# Patient Record
Sex: Male | Born: 1955 | Race: White | Hispanic: No | Marital: Married | Smoking: Never smoker
Health system: Southern US, Community
[De-identification: ages and names within clinical notes are randomized; demographics above are authoritative.]

## PROBLEM LIST (undated history)

## (undated) DIAGNOSIS — I10 Essential (primary) hypertension: Secondary | ICD-10-CM

## (undated) DIAGNOSIS — E119 Type 2 diabetes mellitus without complications: Secondary | ICD-10-CM

## (undated) HISTORY — PX: COLONOSCOPY: SHX174

---

## 2012-04-27 ENCOUNTER — Ambulatory Visit: Payer: Self-pay | Admitting: Internal Medicine

## 2012-05-07 ENCOUNTER — Ambulatory Visit: Payer: Self-pay | Admitting: Internal Medicine

## 2013-07-27 DIAGNOSIS — E785 Hyperlipidemia, unspecified: Secondary | ICD-10-CM | POA: Insufficient documentation

## 2013-10-13 ENCOUNTER — Ambulatory Visit: Payer: Self-pay | Admitting: Gastroenterology

## 2015-01-30 DIAGNOSIS — IMO0002 Reserved for concepts with insufficient information to code with codable children: Secondary | ICD-10-CM | POA: Insufficient documentation

## 2015-02-02 DIAGNOSIS — Z79899 Other long term (current) drug therapy: Secondary | ICD-10-CM | POA: Insufficient documentation

## 2016-02-08 DIAGNOSIS — E538 Deficiency of other specified B group vitamins: Secondary | ICD-10-CM | POA: Insufficient documentation

## 2016-08-07 DIAGNOSIS — N521 Erectile dysfunction due to diseases classified elsewhere: Secondary | ICD-10-CM

## 2016-08-07 DIAGNOSIS — E1169 Type 2 diabetes mellitus with other specified complication: Secondary | ICD-10-CM | POA: Insufficient documentation

## 2016-08-08 DIAGNOSIS — E291 Testicular hypofunction: Secondary | ICD-10-CM | POA: Insufficient documentation

## 2017-09-05 ENCOUNTER — Encounter: Payer: Self-pay | Admitting: Emergency Medicine

## 2017-09-05 ENCOUNTER — Other Ambulatory Visit: Payer: Self-pay

## 2017-09-05 ENCOUNTER — Ambulatory Visit
Admission: EM | Admit: 2017-09-05 | Discharge: 2017-09-05 | Disposition: A | Discharge status: Home / Self Care | Payer: BC Managed Care – PPO | Attending: Family Medicine | Admitting: Family Medicine

## 2017-09-05 DIAGNOSIS — T25222A Burn of second degree of left foot, initial encounter: Secondary | ICD-10-CM

## 2017-09-05 DIAGNOSIS — Z23 Encounter for immunization: Secondary | ICD-10-CM | POA: Diagnosis not present

## 2017-09-05 HISTORY — DX: Type 2 diabetes mellitus without complications: E11.9

## 2017-09-05 HISTORY — DX: Essential (primary) hypertension: I10

## 2017-09-05 MED ORDER — TETANUS-DIPHTH-ACELL PERTUSSIS 5-2.5-18.5 LF-MCG/0.5 IM SUSP
0.5000 mL | Freq: Once | INTRAMUSCULAR | Status: AC
  Administered 2017-09-05: 0.5 mL via INTRAMUSCULAR

## 2017-09-05 MED ORDER — CEPHALEXIN 500 MG PO CAPS: 500.0000 mg | ORAL_CAPSULE | Freq: Three times a day (TID) | ORAL | 0 refills | Status: AC

## 2017-09-05 MED ORDER — SILVER SULFADIAZINE 1 % EX CREA: 1.0000 "application " | TOPICAL_CREAM | Freq: Every day | CUTANEOUS | 0 refills | Status: DC

## 2017-09-05 NOTE — ED Triage Notes (Signed)
Patient c/o burn to the bottom of his left foot on Tuesday. Patient stated he stepped on an electric charcoal starter. Patient has been applying antibiotic ointment to the area and has kept it covered with gauze.

## 2017-09-05 NOTE — ED Provider Notes (Signed)
[facility] ____________________________________________  Time seen: Approximately 7:13 PM  I have reviewed the triage vital signs and the nursing notes.   HISTORY  Chief Complaint Foot Burn   HPI Jesse Mays is a 62 y.o. male presenting for evaluation of left plantar foot burn that occurred this past Tuesday night.  States that he was using an electric charcoal grill starter to start his grill, tested on the ground, but then accidentally stepped on it barefoot.  States for the last few days the area has remained intact and has not been very tender, but has changed his gait to walk on the side of his foot more.  Today he has been using a cane to walk as well, but states that the area was more tender prompted him to come in.  Denies any drainage.  No paresthesias or pain unless blisters directly touch.  No other pain or discomfort radiation.  No fevers.  Reports otherwise feels well.  Has not been taking any oral medications for the same complaints.  States has been keeping clean and applying topical Neosporin.  Type II diabetic on oral metformin.  Unsure of last tetanus immunization. Denies recent sickness. Denies recent antibiotic use.   Mickey Farberhies, David, MD: PCP   Past Medical History:  Diagnosis Date  . Diabetes mellitus without complication (HCC)   . Hypertension     There are no active problems to display for this patient.   History reviewed. No pertinent surgical history.   No current facility-administered medications for this encounter.   Current Outpatient Medications:  .  felodipine (PLENDIL) 5 MG 24 hr tablet, Take by mouth., Disp: , Rfl:  .  glucose blood (ONE TOUCH ULTRA TEST) test strip, USE 2 (TWO) TIMES DAILY. USE AS INSTRUCTED., Disp: , Rfl:  .  lisinopril (PRINIVIL,ZESTRIL) 40 MG tablet, Take by mouth., Disp: , Rfl:  .  metFORMIN (GLUCOPHAGE-XR) 500 MG 24 hr tablet, Take by mouth., Disp: , Rfl:  .  simvastatin (ZOCOR) 20 MG tablet, Take by  mouth., Disp: , Rfl:  .  vitamin B-12 (CYANOCOBALAMIN) 1000 MCG tablet, Take by mouth., Disp: , Rfl:  .  cephALEXin (KEFLEX) 500 MG capsule, Take 1 capsule (500 mg total) by mouth 3 (three) times daily for 7 days., Disp: 21 capsule, Rfl: 0 .  silver sulfADIAZINE (SILVADENE) 1 % cream, Apply 1 application topically daily., Disp: 50 g, Rfl: 0  Allergies Patient has no known allergies.  History reviewed. No pertinent family history.  Social History Social History   Tobacco Use  . Smoking status: Never Smoker  . Smokeless tobacco: Never Used  Substance Use Topics  . Alcohol use: Yes    Comment: socially  . Drug use: Never    Review of Systems Constitutional: No fever/chills Cardiovascular: Denies chest pain. Respiratory: Denies shortness of breath. Gastrointestinal: No abdominal pain.   Skin: as above.   ____________________________________________   PHYSICAL EXAM:  VITAL SIGNS: ED Triage Vitals [09/05/17 1827]  Enc Vitals Group     BP (!) 146/85     Pulse Rate 67     Resp 18     Temp 98.1 F (36.7 C)     Temp Source Oral     SpO2 98 %     Weight 210 lb (95.3 kg)     Height 5\' 9"  (1.753 m)     Head Circumference      Peak Flow      Pain Score 5     Pain  Loc      Pain Edu?      Excl. in GC?     Constitutional: Alert and oriented. Well appearing and in no acute distress. ENT      Head: Normocephalic and atraumatic. Cardiovascular: Normal rate, regular rhythm. Grossly normal heart sounds.  Good peripheral circulation. Respiratory: Normal respiratory effort without tachypnea nor retractions. Breath sounds are clear and equal bilaterally. No wheezes, rales, rhonchi. Musculoskeletal:   Bilateral pedal pulses equal and easily palpated. Neurologic:  Normal speech and language.Speech is normal. No gait instability.  Skin:  Skin is warm, dry.  Except:     Left plantar burn as depicted above, mild surrounding blanching erythema, blister intact without drainage and  mildly tender to direct palpation, no surrounding tenderness, no bony tenderness, normal distal sensation and capillary refill, left foot otherwise nontender and skin otherwise intact to left foot.  Left foot with full range of motion present.  Psychiatric: Mood and affect are normal. Speech and behavior are normal. Patient exhibits appropriate insight and judgment   ___________________________________________   LABS (all labs ordered are listed, but only abnormal results are displayed)  Labs Reviewed - No data to display ____________________________________________  PROCEDURES Procedures   INITIAL IMPRESSION / ASSESSMENT AND PLAN / ED COURSE  Pertinent labs & imaging results that were available during my care of the patient were reviewed by me and considered in my medical decision making (see chart for details).  Well-appearing patient.  No acute distress.  Left plantar foot burn, second-degree, concern for possible secondary infection versus inflammatory pain.  Tetanus immunization updated.  Will treat with oral Keflex and topical Silvadene.  Discussed wound care and cleaning.  Recommend close follow-up.  Follow-up with primary care, but also recommend follow-up with [facility], information given and directed to call today to schedule outpatient appointment.  Discussed strict sooner follow-up and return parameters.Discussed indication, risks and benefits of medications with patient.   Discussed follow up and return parameters including no resolution or any worsening concerns. Patient verbalized understanding and agreed to plan.   Reviewed patient and plan of care with Dr Judd Gaudier who also saw patient, and agreed to plan.   ____________________________________________   FINAL CLINICAL IMPRESSION(S) / ED DIAGNOSES  Final diagnoses:  Partial thickness burn of left foot, initial encounter     ED Discharge Orders         Ordered    silver sulfADIAZINE (SILVADENE) 1 % cream  Daily      09/05/17 1924    cephALEXin (KEFLEX) 500 MG capsule  3 times daily     09/05/17 1924           Note: This dictation was prepared with Dragon dictation along with smaller phrase technology. Any transcriptional errors that result from this process are unintentional.         Renford Dills, NP 09/05/17 1950

## 2017-09-05 NOTE — Discharge Instructions (Addendum)
Take medication as prescribed and use topical antibiotic.  Keep clean.  Monitor closely.  [facility]  See below to call and schedule appointment.  Follow up with your primary care physician this week as needed. Return to Urgent care or emergency room for any increased pain, swelling, drainage or worsening concerns.   [facility] [id number] [place], [place] [id number] Burn [facility]: Appointments # [phone]) [phone]

## 2017-10-09 ENCOUNTER — Emergency Department: Payer: BC Managed Care – PPO

## 2017-10-09 ENCOUNTER — Encounter: Payer: Self-pay | Admitting: Intensive Care

## 2017-10-09 ENCOUNTER — Inpatient Hospital Stay
Admission: EM | Admit: 2017-10-09 | Discharge: 2017-10-15 | DRG: 871 | Disposition: A | Discharge status: Home / Self Care | Payer: BC Managed Care – PPO | Attending: Internal Medicine | Admitting: Internal Medicine

## 2017-10-09 ENCOUNTER — Other Ambulatory Visit: Payer: Self-pay

## 2017-10-09 DIAGNOSIS — A419 Sepsis, unspecified organism: Secondary | ICD-10-CM | POA: Diagnosis present

## 2017-10-09 DIAGNOSIS — R945 Abnormal results of liver function studies: Secondary | ICD-10-CM | POA: Diagnosis present

## 2017-10-09 DIAGNOSIS — Z7141 Alcohol abuse counseling and surveillance of alcoholic: Secondary | ICD-10-CM | POA: Diagnosis not present

## 2017-10-09 DIAGNOSIS — Z79899 Other long term (current) drug therapy: Secondary | ICD-10-CM

## 2017-10-09 DIAGNOSIS — Z7984 Long term (current) use of oral hypoglycemic drugs: Secondary | ICD-10-CM | POA: Diagnosis not present

## 2017-10-09 DIAGNOSIS — I10 Essential (primary) hypertension: Secondary | ICD-10-CM | POA: Diagnosis present

## 2017-10-09 DIAGNOSIS — J810 Acute pulmonary edema: Secondary | ICD-10-CM | POA: Diagnosis not present

## 2017-10-09 DIAGNOSIS — R6520 Severe sepsis without septic shock: Secondary | ICD-10-CM | POA: Diagnosis present

## 2017-10-09 DIAGNOSIS — I152 Hypertension secondary to endocrine disorders: Secondary | ICD-10-CM | POA: Diagnosis present

## 2017-10-09 DIAGNOSIS — E1310 Other specified diabetes mellitus with ketoacidosis without coma: Secondary | ICD-10-CM | POA: Diagnosis present

## 2017-10-09 DIAGNOSIS — E1110 Type 2 diabetes mellitus with ketoacidosis without coma: Secondary | ICD-10-CM | POA: Diagnosis present

## 2017-10-09 DIAGNOSIS — K852 Alcohol induced acute pancreatitis without necrosis or infection: Secondary | ICD-10-CM | POA: Diagnosis present

## 2017-10-09 DIAGNOSIS — E876 Hypokalemia: Secondary | ICD-10-CM | POA: Diagnosis present

## 2017-10-09 DIAGNOSIS — K8521 Alcohol induced acute pancreatitis with uninfected necrosis: Secondary | ICD-10-CM | POA: Diagnosis not present

## 2017-10-09 DIAGNOSIS — E1159 Type 2 diabetes mellitus with other circulatory complications: Secondary | ICD-10-CM | POA: Diagnosis present

## 2017-10-09 DIAGNOSIS — J9601 Acute respiratory failure with hypoxia: Secondary | ICD-10-CM | POA: Diagnosis present

## 2017-10-09 DIAGNOSIS — Z7982 Long term (current) use of aspirin: Secondary | ICD-10-CM

## 2017-10-09 DIAGNOSIS — I517 Cardiomegaly: Secondary | ICD-10-CM | POA: Diagnosis not present

## 2017-10-09 DIAGNOSIS — E1169 Type 2 diabetes mellitus with other specified complication: Secondary | ICD-10-CM

## 2017-10-09 DIAGNOSIS — K859 Acute pancreatitis without necrosis or infection, unspecified: Secondary | ICD-10-CM | POA: Diagnosis not present

## 2017-10-09 DIAGNOSIS — Z716 Tobacco abuse counseling: Secondary | ICD-10-CM | POA: Diagnosis not present

## 2017-10-09 DIAGNOSIS — Z833 Family history of diabetes mellitus: Secondary | ICD-10-CM | POA: Diagnosis not present

## 2017-10-09 DIAGNOSIS — E8770 Fluid overload, unspecified: Secondary | ICD-10-CM | POA: Diagnosis not present

## 2017-10-09 DIAGNOSIS — R0602 Shortness of breath: Secondary | ICD-10-CM

## 2017-10-09 DIAGNOSIS — R17 Unspecified jaundice: Secondary | ICD-10-CM | POA: Diagnosis not present

## 2017-10-09 DIAGNOSIS — R109 Unspecified abdominal pain: Secondary | ICD-10-CM

## 2017-10-09 LAB — URINALYSIS, ROUTINE W REFLEX MICROSCOPIC
BACTERIA UA: NONE SEEN
BILIRUBIN URINE: NEGATIVE
Hgb urine dipstick: NEGATIVE
Ketones, ur: 20 mg/dL — AB
LEUKOCYTES UA: NEGATIVE
Nitrite: NEGATIVE
PROTEIN: NEGATIVE mg/dL
Specific Gravity, Urine: 1.026 (ref 1.005–1.030)
Squamous Epithelial / LPF: NONE SEEN (ref 0–5)
pH: 5 (ref 5.0–8.0)

## 2017-10-09 LAB — COMPREHENSIVE METABOLIC PANEL
ALBUMIN: 4.9 g/dL (ref 3.5–5.0)
ALK PHOS: 60 U/L (ref 38–126)
ALT: 23 U/L (ref 0–44)
ANION GAP: 16 — AB (ref 5–15)
AST: 25 U/L (ref 15–41)
BILIRUBIN TOTAL: 1 mg/dL (ref 0.3–1.2)
BUN: 15 mg/dL (ref 8–23)
CALCIUM: 9.8 mg/dL (ref 8.9–10.3)
CO2: 22 mmol/L (ref 22–32)
Chloride: 102 mmol/L (ref 98–111)
Creatinine, Ser: 1.08 mg/dL (ref 0.61–1.24)
GFR calc Af Amer: >60 mL/min (ref >60)
GLUCOSE: 328 mg/dL — AB (ref 70–99)
Potassium: 3.8 mmol/L (ref 3.5–5.1)
Sodium: 140 mmol/L (ref 135–145)
TOTAL PROTEIN: 7.6 g/dL (ref 6.5–8.1)

## 2017-10-09 LAB — CBC
HCT: 51 % (ref 40.0–52.0)
Hemoglobin: 18 g/dL (ref 13.0–18.0)
MCH: 33 pg (ref 26.0–34.0)
MCHC: 35.3 g/dL (ref 32.0–36.0)
MCV: 93.7 fL (ref 80.0–100.0)
Platelets: 289 10*3/uL (ref 150–440)
RBC: 5.45 MIL/uL (ref 4.40–5.90)
RDW: 13.1 % (ref 11.5–14.5)
WBC: 26 10*3/uL — ABNORMAL HIGH (ref 3.8–10.6)

## 2017-10-09 LAB — BLOOD GAS, VENOUS
Acid-base deficit: 7.5 mmol/L — ABNORMAL HIGH (ref 0.0–2.0)
BICARBONATE: 20.5 mmol/L (ref 20.0–28.0)
O2 SAT: 52.4 %
PATIENT TEMPERATURE: 37
PH VEN: 7.23 — AB (ref 7.250–7.430)
pCO2, Ven: 49 mmHg (ref 44.0–60.0)
pO2, Ven: 34 mmHg (ref 32.0–45.0)

## 2017-10-09 LAB — LACTIC ACID, PLASMA
LACTIC ACID, VENOUS: 4.8 mmol/L — AB (ref 0.5–1.9)
Lactic Acid, Venous: 4.2 mmol/L (ref 0.5–1.9)

## 2017-10-09 LAB — GLUCOSE, CAPILLARY
Glucose-Capillary: 331 mg/dL — ABNORMAL HIGH (ref 70–99)
Glucose-Capillary: 322 mg/dL — ABNORMAL HIGH (ref 70–99)
Glucose-Capillary: 228 mg/dL — ABNORMAL HIGH (ref 70–99)

## 2017-10-09 LAB — LIPASE, BLOOD: Lipase: 748 U/L — ABNORMAL HIGH (ref 11–51)

## 2017-10-09 MED ORDER — ONDANSETRON 4 MG PO TBDP: 4.0000 mg | ORAL_TABLET | Freq: Once | ORAL | Status: DC | PRN

## 2017-10-09 MED ORDER — ONDANSETRON HCL 4 MG/2ML IJ SOLN
4.0000 mg | Freq: Once | INTRAMUSCULAR | Status: AC
  Administered 2017-10-09: 4 mg via INTRAVENOUS
  Filled 2017-10-09: qty 2

## 2017-10-09 MED ORDER — SODIUM CHLORIDE 0.9 % IV SOLN
1.0000 g | Freq: Three times a day (TID) | INTRAVENOUS | Status: DC
  Administered 2017-10-10 – 2017-10-13 (×11): 1 g via INTRAVENOUS
  Filled 2017-10-09 (×15): qty 1

## 2017-10-09 MED ORDER — HYDROMORPHONE HCL 1 MG/ML IJ SOLN
1.0000 mg | Freq: Once | INTRAMUSCULAR | Status: AC
  Administered 2017-10-09: 1 mg via INTRAVENOUS
  Filled 2017-10-09: qty 1

## 2017-10-09 MED ORDER — SODIUM CHLORIDE 0.9 % IV SOLN
INTRAVENOUS | Status: DC
  Administered 2017-10-09: 2.7 [IU]/h via INTRAVENOUS
  Filled 2017-10-09: qty 1

## 2017-10-09 MED ORDER — SODIUM CHLORIDE 0.9 % IV BOLUS
1000.0000 mL | Freq: Once | INTRAVENOUS | Status: AC
  Administered 2017-10-09: 1000 mL via INTRAVENOUS

## 2017-10-09 MED ORDER — PIPERACILLIN-TAZOBACTAM 3.375 G IVPB 30 MIN
3.3750 g | Freq: Once | INTRAVENOUS | Status: AC
  Administered 2017-10-09: 3.375 g via INTRAVENOUS
  Filled 2017-10-09: qty 50

## 2017-10-09 MED ORDER — IOPAMIDOL (ISOVUE-300) INJECTION 61%
100.0000 mL | Freq: Once | INTRAVENOUS | Status: AC | PRN
  Administered 2017-10-09: 100 mL via INTRAVENOUS

## 2017-10-09 MED ORDER — PROMETHAZINE HCL 25 MG/ML IJ SOLN
25.0000 mg | Freq: Once | INTRAMUSCULAR | Status: AC
  Administered 2017-10-09: 25 mg via INTRAVENOUS
  Filled 2017-10-09: qty 1

## 2017-10-09 NOTE — ED Notes (Signed)
Patient transported to CT 

## 2017-10-09 NOTE — ED Triage Notes (Signed)
Patient presents with abd pain with N/V/D that started today. Reports chills at home. Took tylenol around 5pm.

## 2017-10-09 NOTE — ED Notes (Signed)
MD Sharma Covert notified that pts HR increased to 136 and oxygen decreased to 88%. MD at bedside. 2L nasal cannula applied.

## 2017-10-09 NOTE — ED Notes (Signed)
Charge nurse notified of lactic acid

## 2017-10-09 NOTE — Progress Notes (Signed)
CODE SEPSIS - PHARMACY COMMUNICATION  **Broad Spectrum Antibiotics should be administered within 1 hour of Sepsis diagnosis**  Time Code Sepsis Called/Page Received: 19:39  Antibiotics Ordered: Zosyn  Time of 1st antibiotic administration: 20:19 spoke with Trula Ore RN - she's only been able to get one set of blood cultures but she'll see if EDP wants to start Zosyn before second set.  Additional action taken by pharmacy: 20:21  If necessary, Name of Provider/Nurse Contacted:     Carola Frost, Pharm.D., BCPS Clinical Pharmacist  10/09/2017  7:45 PM

## 2017-10-09 NOTE — Progress Notes (Signed)
Pharmacy Antibiotic Note  Jesse Mays is a 62 y.o. male admitted on 10/09/2017 with intra-abdominal infection.  Pharmacy has been consulted for meropenem dosing.  Plan: Meropenem 1 gram q 8 hours ordered.  Height: 5\' 9"  (175.3 cm) Weight: 210 lb (95.3 kg) IBW/kg (Calculated) : 70.7  Temp (24hrs), Avg:98.2 F (36.8 C), Min:98.1 F (36.7 C), Max:98.2 F (36.8 C)  Recent Labs  Lab 10/09/17 1837 10/09/17 2051  WBC 26.0*  --   CREATININE 1.08  --   LATICACIDVEN 4.2* 4.8*    Estimated Creatinine Clearance: 80.7 mL/min (by C-G formula based on SCr of 1.08 mg/dL).    No Known Allergies  Antimicrobials this admission: Zosyn x1; meropenem 10/4  >>    >>   Dose adjustments this admission:   Microbiology results: 10/3 BCx: pending 10/3 UCx: pending       10/3 UA: (-)  Thank you for allowing pharmacy to be a part of this patient's care.  Myrtie Leuthold S 10/09/2017 11:13 PM

## 2017-10-09 NOTE — ED Notes (Signed)
Charge nurse notified of pt's WBC; bed requested

## 2017-10-09 NOTE — H&P (Addendum)
[facility] [place] [facility]   PATIENT NAME: Jesse Mays    MR#:  [id number]  DATE OF BIRTH:  [birth year]  DATE OF ADMISSION:  10/09/2017  PRIMARY CARE PHYSICIAN: Mickey Farber, MD   REQUESTING/REFERRING PHYSICIAN: Sharma Covert, MD  CHIEF COMPLAINT:   Chief Complaint  Patient presents with  . Abdominal Pain  . Fever  . Diarrhea    HISTORY OF PRESENT ILLNESS:  Jesse Mays  is a 62 y.o. male who presents with chief complaint as above.  Patient presents to the ED with sudden onset of upper abdominal pain.  He also developed a fever.  Here in the ED he is found to meet sepsis criteria with elevated white blood cell count, tachycardia.  On work-up he was found to have pancreatitis with possible early necrosis seen on imaging.  Hospitalist were called for admission  PAST MEDICAL HISTORY:   Past Medical History:  Diagnosis Date  . Diabetes mellitus without complication (HCC)   . Hypertension      PAST SURGICAL HISTORY:   Past Surgical History:  Procedure Laterality Date  . COLONOSCOPY       SOCIAL HISTORY:   Social History   Tobacco Use  . Smoking status: Never Smoker  . Smokeless tobacco: Never Used  Substance Use Topics  . Alcohol use: Yes    Comment: socially     FAMILY HISTORY:   Family History  Problem Relation Age of Onset  . Diabetes Mother   . Diabetes Father      DRUG ALLERGIES:  No Known Allergies  MEDICATIONS AT HOME:   Prior to Admission medications   Medication Sig Start Date End Date Taking? Authorizing Provider  aspirin EC 81 MG tablet Take 81 mg by mouth daily.   Yes [provider]  felodipine (PLENDIL) 5 MG 24 hr tablet Take 5 mg by mouth daily.  05/13/17  Yes [provider]  glucose blood (ONE TOUCH ULTRA TEST) test strip 1 each by Other route 2 (two) times daily.  05/19/17  Yes [provider]  lisinopril (PRINIVIL,ZESTRIL) 40 MG tablet Take 40 mg by mouth daily.   01/31/17 01/31/18 Yes [provider]  metFORMIN (GLUCOPHAGE-XR) 500 MG 24 hr tablet Take 1,000 mg by mouth 2 (two) times daily.  09/01/17  Yes [provider]  simvastatin (ZOCOR) 20 MG tablet Take 20 mg by mouth daily.  09/01/17  Yes [provider]  vitamin B-12 (CYANOCOBALAMIN) 1000 MCG tablet Take 1,000 mcg by mouth daily.    Yes [provider]  silver sulfADIAZINE (SILVADENE) 1 % cream Apply 1 application topically daily. Patient not taking: Reported on 10/09/2017 09/05/17   Renford Dills, NP    REVIEW OF SYSTEMS:  Review of Systems  Constitutional: Positive for fever. Negative for chills, malaise/fatigue and weight loss.  HENT: Negative for ear pain, hearing loss and tinnitus.   Eyes: Negative for blurred vision, double vision, pain and redness.  Respiratory: Negative for cough, hemoptysis and shortness of breath.   Cardiovascular: Negative for chest pain, palpitations, orthopnea and leg swelling.  Gastrointestinal: Positive for abdominal pain and nausea. Negative for constipation, diarrhea and vomiting.  Genitourinary: Negative for dysuria, frequency and hematuria.  Musculoskeletal: Negative for back pain, joint pain and neck pain.  Skin:       No acne, rash, or lesions  Neurological: Negative for dizziness, tremors, focal weakness and weakness.  Endo/Heme/Allergies: Negative for polydipsia. Does not bruise/bleed easily.  Psychiatric/Behavioral: Negative for depression. The  patient is not nervous/anxious and does not have insomnia.      VITAL SIGNS:   Vitals:   10/09/17 2151 10/09/17 2200 10/09/17 2218 10/09/17 2230  BP: (!) 187/108 (!) 188/106  (!) 190/106  Pulse: (!) 107 (!) 101 (!) 137 (!) 119  Resp: 18 (!) 22 (!) 22 12  Temp: 98.2 F (36.8 C)     TempSrc: Oral     SpO2: 94% 94% (!) 88% 98%  Weight:      Height:       Wt Readings from Last 3 Encounters:  10/09/17 95.3 kg  09/05/17 95.3 kg    PHYSICAL EXAMINATION:  Physical Exam   Vitals reviewed. Constitutional: He is oriented to person, place, and time. He appears well-developed and well-nourished. No distress.  HENT:  Head: Normocephalic and atraumatic.  Mouth/Throat: Oropharynx is clear and moist.  Eyes: Pupils are equal, round, and reactive to light. Conjunctivae and EOM are normal. No scleral icterus.  Neck: Normal range of motion. Neck supple. No JVD present. No thyromegaly present.  Cardiovascular: Regular rhythm and intact distal pulses. Exam reveals no gallop and no friction rub.  No murmur heard. tachycardic  Respiratory: Effort normal and breath sounds normal. No respiratory distress. He has no wheezes. He has no rales.  GI: Soft. Bowel sounds are normal. He exhibits no distension. There is tenderness.  Musculoskeletal: Normal range of motion. He exhibits no edema.  No arthritis, no gout  Lymphadenopathy:    He has no cervical adenopathy.  Neurological: He is alert and oriented to person, place, and time. No cranial nerve deficit.  No dysarthria, no aphasia  Skin: Skin is warm and dry. No rash noted. No erythema.  Psychiatric: He has a normal mood and affect. His behavior is normal. Judgment and thought content normal.    LABORATORY PANEL:   CBC Recent Labs  Lab 10/09/17 1837  WBC 26.0*  HGB 18.0  HCT 51.0  PLT 289   ------------------------------------------------------------------------------------------------------------------  Chemistries  Recent Labs  Lab 10/09/17 1837  NA 140  K 3.8  CL 102  CO2 22  GLUCOSE 328*  BUN 15  CREATININE 1.08  CALCIUM 9.8  AST 25  ALT 23  ALKPHOS 60  BILITOT 1.0   ------------------------------------------------------------------------------------------------------------------  Cardiac Enzymes No results for input(s): TROPONINI in the last 168 hours. ------------------------------------------------------------------------------------------------------------------  RADIOLOGY:  Ct Abdomen  Pelvis W Contrast  Result Date: 10/09/2017 CLINICAL DATA:  Generalized abdominal pain with nausea and vomiting EXAM: CT ABDOMEN AND PELVIS WITH CONTRAST TECHNIQUE: Multidetector CT imaging of the abdomen and pelvis was performed using the standard protocol following bolus administration of intravenous contrast. CONTRAST:  ISOVUE-300 IOPAMIDOL (ISOVUE-300) INJECTION 61% COMPARISON:  None. FINDINGS: Lower chest: Lung bases demonstrate no acute consolidation or effusion. The heart size is within normal limits. Hepatobiliary: No focal liver abnormality is seen. No gallstones, gallbladder wall thickening, or biliary dilatation. Pancreas: Large amount of peripancreatic inflammation and fluid consistent with acute pancreatitis. There is significant fatty replacement of the pancreas. There are areas of hypoenhancement at the pancreatic head and neck. No organized fluid collection. Spleen: Normal in size without focal abnormality. Adrenals/Urinary Tract: Adrenal glands are normal. Cyst in the upper pole right kidney. No hydronephrosis. Bladder unremarkable Stomach/Bowel: Stomach is within normal limits. Appendix is not well seen but no right lower quadrant inflammation. Sigmoid colon diverticular disease without acute inflammatory process. No evidence of bowel wall thickening, distention, or inflammatory changes. Vascular/Lymphatic: Nonaneurysmal aorta. Moderate aortic atherosclerosis. No significantly enlarged lymph  nodes. Normal portal vein and splenic vein enhancement. Reproductive: Prostate is unremarkable. Other: No free air. Moderate fluid within the right greater than left paracolic gutters. Musculoskeletal: No acute or significant osseous findings. IMPRESSION: 1. Moderate severe inflammatory change and fluid centered around the pancreas, consistent with acute pancreatitis. No organized fluid collections at this time. Mild hypoenhancement of the head and neck of the pancreas, possible early necrosis changes.  2. Sigmoid colon diverticular disease without acute inflammatory change. Electronically Signed   By: Jasmine Pang M.D.   On: 10/09/2017 21:40    EKG:   Orders placed or performed during the hospital encounter of 10/09/17  . EKG 12-Lead  . EKG 12-Lead  . EKG 12-Lead  . EKG 12-Lead  . ED EKG 12-Lead  . ED EKG 12-Lead    IMPRESSION AND PLAN:  Principal Problem:   Severe sepsis (HCC) -likely related to his pancreatitis has no other clear source was elucidated on work-up.  We are covering him with antibiotics.  His lactic acid was significantly elevated, IV fluid resuscitation was administered and we will continue IV fluids as well as trending his lactic until within normal limits.  His blood pressure has been stable, even somewhat elevated.  Cultures were sent. Active Problems:   Pancreatitis -keep patient n.p.o. for now, other treatment as above, repeat CT A/P in AM to evaluate for evolution of possible necrosis, GI consult   Diabetes (HCC) -significantly elevated.  The patient's anion gap on initial BMP was elevated, but barely so at 16.  He was started on insulin drip by ED physician, we will recheck his BMP and I suspect will be able to discontinue this even before admission to the floor.  Once were able to take him off the insulin drip, will administer sliding scale insulin with corresponding glucose checks.  We will do this every 6 hours for now as he is n.p.o.   HTN (hypertension) -continue home dose antihypertensives.  I suspect some of his elevated blood pressure is due to discomfort from pain related to his pancreatitis.  If he requires additional antihypertensives we will administer these PRN with a blood pressure goal less than 160/100  Chart review performed and case discussed with ED provider. Labs, imaging and/or ECG reviewed by provider and discussed with patient/family. Management plans discussed with the patient and/or family.  DVT PROPHYLAXIS: SubQ lovenox   GI  PROPHYLAXIS:  None  ADMISSION STATUS: Inpatient     CODE STATUS: Full  TOTAL TIME TAKING CARE OF THIS PATIENT: 45 minutes.   Nereyda Bowler FIELDING 10/09/2017, 11:02 PM  [employer]  [phone]  CC: Primary care physician; Mickey Farber, MD  Note:  This document was prepared using Dragon voice recognition software and may include unintentional dictation errors.

## 2017-10-09 NOTE — ED Provider Notes (Addendum)
[facility] Emergency Department Provider Note  ____________________________________________  Time seen: Approximately 7:48 PM  I have reviewed the triage vital signs and the nursing notes.   HISTORY  Chief Complaint Abdominal Pain; Fever; and Diarrhea    HPI Jesse Mays is a 62 y.o. male with a history of HTN, DM, presenting with abdominal pain, nausea vomiting and diarrhea.  The patient reports that at 230 this afternoon, he had acute onset of nausea vomiting and nonbloody diarrhea with associated use abdominal pain.  He did take his morning diabetes medications but has otherwise been unable to keep anything down.  He took Tylenol prior to leaving the house.  He denies any fevers or chills, dysuria or urinary frequency.  No lightheadedness or syncope.  SH: One beer daily.  Past Medical History:  Diagnosis Date  . Diabetes mellitus without complication (HCC)   . Hypertension     There are no active problems to display for this patient.   History reviewed. No pertinent surgical history.  Current Outpatient Rx  . Order #: [id number] Class: Historical Med  . Order #: [id number] Class: Historical Med  . Order #: [id number] Class: Historical Med  . Order #: [id number] Class: Historical Med  . Order #: [id number] Class: Historical Med  . Order #: [id number] Class: Historical Med  . Order #: [id number] Class: Historical Med  . Order #: [id number] Class: Normal    Allergies Patient has no known allergies.  History reviewed. No pertinent family history.  Social History Social History   Tobacco Use  . Smoking status: Never Smoker  . Smokeless tobacco: Never Used  Substance Use Topics  . Alcohol use: Yes    Comment: socially  . Drug use: Never    Review of Systems Constitutional: No fever/chills.  No lightheadedness or syncope.   Eyes: No visual changes. ENT: No congestion or rhinorrhea. Cardiovascular: Denies chest pain. Denies  palpitations. Respiratory: Denies shortness of breath.  No cough. Gastrointestinal: Positive epigastric abdominal pain.  Positive nausea, positive vomiting.  Positive diarrhea.  No constipation. Genitourinary: Negative for dysuria.  No urinary frequency. Musculoskeletal: Negative for back pain. Skin: Negative for rash. Neurological: Negative for headaches. No focal numbness, tingling or weakness.     ____________________________________________   PHYSICAL EXAM:  VITAL SIGNS: ED Triage Vitals  Enc Vitals Group     BP 10/09/17 1820 (!) 195/101     Pulse Rate 10/09/17 1820 (!) 101     Resp 10/09/17 1820 16     Temp 10/09/17 1820 98.1 F (36.7 C)     Temp Source 10/09/17 1820 Oral     SpO2 10/09/17 1820 99 %     Weight 10/09/17 1820 210 lb (95.3 kg)     Height 10/09/17 1820 5\' 9"  (1.753 m)     Head Circumference --      Peak Flow --      Pain Score 10/09/17 1826 7     Pain Loc --      Pain Edu? --      Excl. in GC? --     Constitutional: Alert and oriented.  Answers questions appropriately.  Uncomfortable appearing and pale. Eyes: Conjunctivae are normal.  EOMI. No scleral icterus. Head: Atraumatic. Nose: No congestion/rhinnorhea. Mouth/Throat: Mucous membranes are moist.  Neck: No stridor.  Supple.  No JVD.  No meningismus. Cardiovascular: Normal rate, regular rhythm. No murmurs, rubs or gallops.  Respiratory: Normal respiratory effort.  No accessory muscle use or retractions. Lungs CTAB.  No wheezes, rales or ronchi. Gastrointestinal: Soft,  and nondistended.  Tender to palpation in the epigastrium.  No right upper quadrant tenderness; negative Murphy sign peer no guarding or rebound.  No peritoneal signs. Musculoskeletal: No LE edema.  Neurologic:  A&Ox3.  Speech is clear.  Face and smile are symmetric.  EOMI.  Moves all extremities well. Skin:  Skin is warm, dry and intact. No rash noted. Psychiatric: Mood and affect are normal. Speech and behavior are normal.  Normal  judgement.  ____________________________________________   LABS (all labs ordered are listed, but only abnormal results are displayed)  Labs Reviewed  LIPASE, BLOOD - Abnormal; Notable for the following components:      Result Value   Lipase 748 (*)    All other components within normal limits  COMPREHENSIVE METABOLIC PANEL - Abnormal; Notable for the following components:   Glucose, Bld 328 (*)    Anion gap 16 (*)    All other components within normal limits  CBC - Abnormal; Notable for the following components:   WBC 26.0 (*)    All other components within normal limits  LACTIC ACID, PLASMA - Abnormal; Notable for the following components:   Lactic Acid, Venous 4.2 (*)    All other components within normal limits  LACTIC ACID, PLASMA - Abnormal; Notable for the following components:   Lactic Acid, Venous 4.8 (*)    All other components within normal limits  URINALYSIS, ROUTINE W REFLEX MICROSCOPIC - Abnormal; Notable for the following components:   Color, Urine STRAW (*)    APPearance CLEAR (*)    Glucose, UA >=500 (*)    Ketones, ur 20 (*)    All other components within normal limits  BLOOD GAS, VENOUS - Abnormal; Notable for the following components:   pH, Ven 7.23 (*)    Acid-base deficit 7.5 (*)    All other components within normal limits  GLUCOSE, CAPILLARY - Abnormal; Notable for the following components:   Glucose-Capillary 331 (*)    All other components within normal limits  CULTURE, BLOOD (ROUTINE X 2)  CULTURE, BLOOD (ROUTINE X 2)  URINE CULTURE   ____________________________________________  EKG  ED ECG REPORT I, Anne-Caroline Sharma Covert, the attending physician, personally viewed and interpreted this ECG.   Date: 10/09/2017  EKG Time: 1827  Rate: 102  Rhythm: sinus tachycardia  Axis: leftward  Intervals:none  ST&T Change: No STEMI  ____________________________________________  RADIOLOGY  Ct Abdomen Pelvis W Contrast  Result Date:  10/09/2017 CLINICAL DATA:  Generalized abdominal pain with nausea and vomiting EXAM: CT ABDOMEN AND PELVIS WITH CONTRAST TECHNIQUE: Multidetector CT imaging of the abdomen and pelvis was performed using the standard protocol following bolus administration of intravenous contrast. CONTRAST:  ISOVUE-300 IOPAMIDOL (ISOVUE-300) INJECTION 61% COMPARISON:  None. FINDINGS: Lower chest: Lung bases demonstrate no acute consolidation or effusion. The heart size is within normal limits. Hepatobiliary: No focal liver abnormality is seen. No gallstones, gallbladder wall thickening, or biliary dilatation. Pancreas: Large amount of peripancreatic inflammation and fluid consistent with acute pancreatitis. There is significant fatty replacement of the pancreas. There are areas of hypoenhancement at the pancreatic head and neck. No organized fluid collection. Spleen: Normal in size without focal abnormality. Adrenals/Urinary Tract: Adrenal glands are normal. Cyst in the upper pole right kidney. No hydronephrosis. Bladder unremarkable Stomach/Bowel: Stomach is within normal limits. Appendix is not well seen but no right lower quadrant inflammation. Sigmoid colon diverticular disease without acute inflammatory process. No evidence of bowel wall thickening, distention, or inflammatory changes. Vascular/Lymphatic: Nonaneurysmal aorta. Moderate aortic  atherosclerosis. No significantly enlarged lymph nodes. Normal portal vein and splenic vein enhancement. Reproductive: Prostate is unremarkable. Other: No free air. Moderate fluid within the right greater than left paracolic gutters. Musculoskeletal: No acute or significant osseous findings. IMPRESSION: 1. Moderate severe inflammatory change and fluid centered around the pancreas, consistent with acute pancreatitis. No organized fluid collections at this time. Mild hypoenhancement of the head and neck of the pancreas, possible early necrosis changes. 2. Sigmoid colon diverticular  disease without acute inflammatory change. Electronically Signed   By: Jasmine Pang M.D.   On: 10/09/2017 21:40    ____________________________________________   PROCEDURES  Procedure(s) performed: None  Procedures  Critical Care performed: Yes, see critical care note(s) ____________________________________________   INITIAL IMPRESSION / ASSESSMENT AND PLAN / ED COURSE  Pertinent labs & imaging results that were available during my care of the patient were reviewed by me and considered in my medical decision making (see chart for details).  62 y.o. male with history of HTN and DM, daily beer, presenting with abdominal pain, nausea vomiting and diarrhea, and meeting sepsis criteria.  Overall, the patient is afebrile but tachycardic and hypertensive.  He does have a white blood cell count of 26.4 and a lactic acid of 4.2.  His lipase is greater than 700 and I am concerned about acute pancreatitis; we will get a CT scan for evaluation of necrosis or other complication.  The patient has no pain over the right upper quadrant, but will also be able to evaluate his gallbladder on the CT scan.  The patient will be given empiric Zosyn as well as 3 L of fluid.  He will be started on an insulin drip for his DKA with anion gap of 16.  Plan admission.  ----------------------------------------- 9:45 PM on 10/09/2017 -----------------------------------------  The patient CT scan does show inflammatory changes around the pancreas consistent with pancreatitis; early necrosis is not ruled out.  Clinically, the patient is feeling better and his vital signs have stabilized.  However, his lactic acid has increased.  He will continue to receive aggressive intravenous fluids and the glucose stabilizer for his DKA.  At this time, the patient will be admitted to the hospitalist for continued evaluation and treatment.  ----------------------------------------- 10:28 PM on  10/09/2017 -----------------------------------------  I was called to the bedside because the patient was newly hypoxic with an O2 sat of 88% on room air.  On my reevaluation, the patient had just received Dilaudid and Phenergan, and was somnolent.  He was also poorly positioned in the bed.  We sat the patient back up, and his O2 sats have maintained at 94 to 96% on 2 L nasal cannula.  Once he is more awake, he will be weaned off the oxygen.  I did re-auscultate his lungs, I do not hear any evidence of edema.  I have spoken with Dr. Anne Hahn, the admitting physician about this clinical change.  I have also spoken to Dr. Servando Snare, the GI physician on-call, who is aware of the patient and his team will consult tomorrow.  His recommendation is repeat CT scan in the morning, regardless of lipase value, and to confirm no fluid collection around the pancreas.  CRITICAL CARE Performed by: Rockne Menghini   Total critical care time: 45 minutes  Critical care time was exclusive of separately billable procedures and treating other patients.  Critical care was necessary to treat or prevent imminent or life-threatening deterioration.  Critical care was time spent personally by me on the following activities:  development of treatment plan with patient and/or surrogate as well as nursing, discussions with consultants, evaluation of patient's response to treatment, examination of patient, obtaining history from patient or surrogate, ordering and performing treatments and interventions, ordering and review of laboratory studies, ordering and review of radiographic studies, pulse oximetry and re-evaluation of patient's condition.   ____________________________________________  FINAL CLINICAL IMPRESSION(S) / ED DIAGNOSES  Final diagnoses:  Diabetic ketoacidosis without coma associated with other specified diabetes mellitus (HCC)  Sepsis, due to unspecified organism, unspecified whether acute organ dysfunction  present (HCC)  Acute pancreatitis, unspecified complication status, unspecified pancreatitis type         NEW MEDICATIONS STARTED DURING THIS VISIT:  New Prescriptions   No medications on file      Rockne Menghini, MD 10/09/17 1954    Rockne Menghini, MD 10/09/17 1610    Rockne Menghini, MD 10/09/17 2229

## 2017-10-10 DIAGNOSIS — K8521 Alcohol induced acute pancreatitis with uninfected necrosis: Secondary | ICD-10-CM

## 2017-10-10 LAB — GLUCOSE, CAPILLARY
Glucose-Capillary: 185 mg/dL — ABNORMAL HIGH (ref 70–99)
Glucose-Capillary: 214 mg/dL — ABNORMAL HIGH (ref 70–99)
Glucose-Capillary: 234 mg/dL — ABNORMAL HIGH (ref 70–99)
Glucose-Capillary: 246 mg/dL — ABNORMAL HIGH (ref 70–99)
Glucose-Capillary: 251 mg/dL — ABNORMAL HIGH (ref 70–99)
Glucose-Capillary: 258 mg/dL — ABNORMAL HIGH (ref 70–99)
Glucose-Capillary: 260 mg/dL — ABNORMAL HIGH (ref 70–99)

## 2017-10-10 LAB — CBC
HEMATOCRIT: 51.5 % (ref 40.0–52.0)
HEMOGLOBIN: 17.9 g/dL (ref 13.0–18.0)
MCH: 32.4 pg (ref 26.0–34.0)
MCHC: 34.7 g/dL (ref 32.0–36.0)
MCV: 93.2 fL (ref 80.0–100.0)
Platelets: 212 10*3/uL (ref 150–440)
RBC: 5.53 MIL/uL (ref 4.40–5.90)
RDW: 13.2 % (ref 11.5–14.5)
WBC: 16.8 10*3/uL — ABNORMAL HIGH (ref 3.8–10.6)

## 2017-10-10 LAB — BASIC METABOLIC PANEL
ANION GAP: 8 (ref 5–15)
Anion gap: 14 (ref 5–15)
BUN: 12 mg/dL (ref 8–23)
BUN: 13 mg/dL (ref 8–23)
CALCIUM: 8.1 mg/dL — AB (ref 8.9–10.3)
CHLORIDE: 110 mmol/L (ref 98–111)
CO2: 16 mmol/L — ABNORMAL LOW (ref 22–32)
CO2: 22 mmol/L (ref 22–32)
Calcium: 8.4 mg/dL — ABNORMAL LOW (ref 8.9–10.3)
Chloride: 111 mmol/L (ref 98–111)
Creatinine, Ser: 0.92 mg/dL (ref 0.61–1.24)
Creatinine, Ser: 0.94 mg/dL (ref 0.61–1.24)
GFR calc Af Amer: >60 mL/min (ref >60)
GFR calc Af Amer: >60 mL/min (ref >60)
GFR calc non Af Amer: >60 mL/min (ref >60)
GLUCOSE: 290 mg/dL — AB (ref 70–99)
Glucose, Bld: 266 mg/dL — ABNORMAL HIGH (ref 70–99)
POTASSIUM: 5.3 mmol/L — AB (ref 3.5–5.1)
Potassium: 4.6 mmol/L (ref 3.5–5.1)
SODIUM: 141 mmol/L (ref 135–145)
Sodium: 140 mmol/L (ref 135–145)

## 2017-10-10 LAB — LACTIC ACID, PLASMA
LACTIC ACID, VENOUS: 4.1 mmol/L — AB (ref 0.5–1.9)
Lactic Acid, Venous: 2.3 mmol/L (ref 0.5–1.9)

## 2017-10-10 LAB — TRIGLYCERIDES: Triglycerides: 86 mg/dL (ref <150)

## 2017-10-10 MED ORDER — FELODIPINE ER 5 MG PO TB24
5.0000 mg | ORAL_TABLET | Freq: Every day | ORAL | Status: DC
  Administered 2017-10-10: 5 mg via ORAL
  Filled 2017-10-10: qty 1

## 2017-10-10 MED ORDER — ASPIRIN EC 81 MG PO TBEC
81.0000 mg | DELAYED_RELEASE_TABLET | Freq: Every day | ORAL | Status: DC
  Administered 2017-10-10 – 2017-10-15 (×6): 81 mg via ORAL
  Filled 2017-10-10 (×6): qty 1

## 2017-10-10 MED ORDER — INSULIN GLARGINE 100 UNIT/ML ~~LOC~~ SOLN
10.0000 [IU] | Freq: Every day | SUBCUTANEOUS | Status: DC
  Administered 2017-10-10 – 2017-10-14 (×5): 10 [IU] via SUBCUTANEOUS
  Filled 2017-10-10 (×7): qty 0.1

## 2017-10-10 MED ORDER — LISINOPRIL 20 MG PO TABS
40.0000 mg | ORAL_TABLET | Freq: Every day | ORAL | Status: DC
  Administered 2017-10-10 – 2017-10-15 (×6): 40 mg via ORAL
  Filled 2017-10-10 (×6): qty 2

## 2017-10-10 MED ORDER — ENOXAPARIN SODIUM 40 MG/0.4ML ~~LOC~~ SOLN
40.0000 mg | SUBCUTANEOUS | Status: DC
  Administered 2017-10-10 – 2017-10-14 (×4): 40 mg via SUBCUTANEOUS
  Filled 2017-10-10 (×4): qty 0.4

## 2017-10-10 MED ORDER — LABETALOL HCL 5 MG/ML IV SOLN
INTRAVENOUS | Status: AC
  Filled 2017-10-10: qty 4

## 2017-10-10 MED ORDER — LIDOCAINE 5 % EX PTCH
1.0000 | MEDICATED_PATCH | CUTANEOUS | Status: DC
  Administered 2017-10-11: 1 via TRANSDERMAL
  Filled 2017-10-10 (×6): qty 1

## 2017-10-10 MED ORDER — LABETALOL HCL 200 MG PO TABS: 300.0000 mg | ORAL_TABLET | Freq: Three times a day (TID) | ORAL | Status: DC

## 2017-10-10 MED ORDER — SODIUM CHLORIDE 0.9 % IV SOLN
INTRAVENOUS | Status: DC
  Administered 2017-10-10 (×2): via INTRAVENOUS

## 2017-10-10 MED ORDER — INSULIN ASPART 100 UNIT/ML ~~LOC~~ SOLN
0.0000 [IU] | Freq: Three times a day (TID) | SUBCUTANEOUS | Status: DC
  Administered 2017-10-10: 3 [IU] via SUBCUTANEOUS
  Administered 2017-10-10: 5 [IU] via SUBCUTANEOUS
  Administered 2017-10-10: 3 [IU] via SUBCUTANEOUS
  Administered 2017-10-11 – 2017-10-12 (×5): 2 [IU] via SUBCUTANEOUS
  Administered 2017-10-12: 1 [IU] via SUBCUTANEOUS
  Administered 2017-10-13: 2 [IU] via SUBCUTANEOUS
  Administered 2017-10-13: 3 [IU] via SUBCUTANEOUS
  Administered 2017-10-13 – 2017-10-14 (×2): 2 [IU] via SUBCUTANEOUS
  Administered 2017-10-14: 3 [IU] via SUBCUTANEOUS
  Administered 2017-10-14 – 2017-10-15 (×2): 2 [IU] via SUBCUTANEOUS
  Filled 2017-10-10 (×16): qty 1

## 2017-10-10 MED ORDER — OXYCODONE HCL 5 MG PO TABS
5.0000 mg | ORAL_TABLET | ORAL | Status: DC | PRN
  Administered 2017-10-10 – 2017-10-11 (×2): 5 mg via ORAL
  Filled 2017-10-10 (×3): qty 1

## 2017-10-10 MED ORDER — LABETALOL HCL 5 MG/ML IV SOLN
10.0000 mg | INTRAVENOUS | Status: DC | PRN
  Administered 2017-10-10 (×4): 10 mg via INTRAVENOUS
  Filled 2017-10-10 (×2): qty 4

## 2017-10-10 MED ORDER — ACETAMINOPHEN 650 MG RE SUPP: 650.0000 mg | Freq: Four times a day (QID) | RECTAL | Status: DC | PRN

## 2017-10-10 MED ORDER — ONDANSETRON HCL 4 MG/2ML IJ SOLN
4.0000 mg | Freq: Four times a day (QID) | INTRAMUSCULAR | Status: DC | PRN
  Administered 2017-10-10: 4 mg via INTRAVENOUS
  Filled 2017-10-10: qty 2

## 2017-10-10 MED ORDER — LACTATED RINGERS IV SOLN
INTRAVENOUS | Status: DC
  Administered 2017-10-10 – 2017-10-11 (×5): via INTRAVENOUS

## 2017-10-10 MED ORDER — ACETAMINOPHEN 325 MG PO TABS
650.0000 mg | ORAL_TABLET | Freq: Four times a day (QID) | ORAL | Status: DC | PRN
  Administered 2017-10-11 – 2017-10-14 (×5): 650 mg via ORAL
  Filled 2017-10-10 (×5): qty 2

## 2017-10-10 MED ORDER — ONDANSETRON HCL 4 MG PO TABS: 4.0000 mg | ORAL_TABLET | Freq: Four times a day (QID) | ORAL | Status: DC | PRN

## 2017-10-10 MED ORDER — METOPROLOL TARTRATE 25 MG PO TABS
25.0000 mg | ORAL_TABLET | Freq: Three times a day (TID) | ORAL | Status: DC
  Administered 2017-10-10 – 2017-10-13 (×9): 25 mg via ORAL
  Filled 2017-10-10 (×9): qty 1

## 2017-10-10 MED ORDER — INSULIN ASPART 100 UNIT/ML ~~LOC~~ SOLN: 0.0000 [IU] | Freq: Every day | SUBCUTANEOUS | Status: DC

## 2017-10-10 MED ORDER — FELODIPINE ER 10 MG PO TB24
10.0000 mg | ORAL_TABLET | Freq: Every day | ORAL | Status: DC
  Administered 2017-10-11 – 2017-10-15 (×5): 10 mg via ORAL
  Filled 2017-10-10 (×5): qty 1

## 2017-10-10 MED ORDER — HYDRALAZINE HCL 20 MG/ML IJ SOLN
10.0000 mg | Freq: Four times a day (QID) | INTRAMUSCULAR | Status: DC | PRN
  Administered 2017-10-12: 10 mg via INTRAVENOUS
  Filled 2017-10-10: qty 1

## 2017-10-10 MED ORDER — HYDROMORPHONE HCL 1 MG/ML IJ SOLN
0.5000 mg | INTRAMUSCULAR | Status: DC | PRN
  Administered 2017-10-10 – 2017-10-13 (×11): 0.5 mg via INTRAVENOUS
  Filled 2017-10-10 (×12): qty 0.5

## 2017-10-10 NOTE — Progress Notes (Signed)
Jesse Bouillon, MD [address], [address], [place], [place], [address] [address], [place], [place], [address] Phone: [phone]  Fax: [phone]  Consultation  Referring Provider:     Dr. Allena Katz Primary Care Physician:  Mickey Farber, MD Reason for Consultation:     Abdominal pain  Date of Admission:  10/09/2017 Date of Consultation:  10/10/2017         HPI:   Jesse Mays is a 62 y.o. male presented with abdominal pain, fever and found to have pancreatitis on imaging. Pain started yesterday afternoon, 10/10, severe, sharp, nonradiating, associated with nausea vomiting and diarrhea.  Patient reports multiple episodes of loose stools yesterday.  No blood.  No further emesis or diarrhea today.  No further bowel movements today.  No prior episodes of pancreatitis.  Reports drinking 1 beer daily.  Denies any increase in alcohol intake recently.  Denies any new medications.  Denies any over-the-counter or herbal products.    CT showed large amount of peripancreatic inflammation and fluid, with significant fatty replacement of the pancreas and areas of hypoenhancement at the pancreatic head and neck, possible early necrosis changes.  No organized fluid collections.  Normal liver enzymes and presentation.  Lipase elevated to 748.  Elevated lactate, improving with fluids.  White count elevated to 26 on admission, improved to 16.8 after fluid resuscitation. Past Medical History:  Diagnosis Date  . Diabetes mellitus without complication (HCC)   . Hypertension     Past Surgical History:  Procedure Laterality Date  . COLONOSCOPY      Prior to Admission medications   Medication Sig Start Date End Date Taking? Authorizing Provider  aspirin EC 81 MG tablet Take 81 mg by mouth daily.   Yes [provider]  felodipine (PLENDIL) 5 MG 24 hr tablet Take 5 mg by mouth daily.  05/13/17  Yes [provider]  glucose blood (ONE TOUCH ULTRA TEST) test strip  1 each by Other route 2 (two) times daily.  05/19/17  Yes [provider]  lisinopril (PRINIVIL,ZESTRIL) 40 MG tablet Take 40 mg by mouth daily.  01/31/17 01/31/18 Yes [provider]  metFORMIN (GLUCOPHAGE-XR) 500 MG 24 hr tablet Take 1,000 mg by mouth 2 (two) times daily.  09/01/17  Yes [provider]  simvastatin (ZOCOR) 20 MG tablet Take 20 mg by mouth daily.  09/01/17  Yes [provider]  vitamin B-12 (CYANOCOBALAMIN) 1000 MCG tablet Take 1,000 mcg by mouth daily.    Yes [provider]  silver sulfADIAZINE (SILVADENE) 1 % cream Apply 1 application topically daily. Patient not taking: Reported on 10/09/2017 09/05/17   Renford Dills, NP    Family History  Problem Relation Age of Onset  . Diabetes Mother   . Diabetes Father      Social History   Tobacco Use  . Smoking status: Never Smoker  . Smokeless tobacco: Never Used  Substance Use Topics  . Alcohol use: Yes    Comment: socially  . Drug use: Never    Allergies as of 10/09/2017  . (No Known Allergies)    Review of Systems:    All systems reviewed and negative except where noted in HPI.   Physical Exam:  Vital signs in last 24 hours: Vitals:   10/10/17 0544 10/10/17 0815 10/10/17 1033 10/10/17 1152  BP: (!) 188/109 (!) 188/111 (!) 183/106 (!) 177/106  Pulse: (!) 104 (!) 106 98 99  Resp:  18  (!) 30  Temp:  98.9 F (37.2 C)  TempSrc:    Oral  SpO2:  96% 96% 99%  Weight:      Height:       Last BM Date: 10/09/17 General:   Pleasant, cooperative in NAD Head:  Normocephalic and atraumatic. Eyes:   No icterus.   Conjunctiva pink. PERRLA. Ears:  Normal auditory acuity. Neck:  Supple; no masses or thyroidomegaly Lungs: Respirations even and unlabored. Lungs clear to auscultation bilaterally.   No wheezes, crackles, or rhonchi.  Abdomen:  Soft, nondistended, nontender. Normal bowel sounds. No appreciable masses or hepatomegaly.  No rebound or guarding.  Neurologic:  Alert  and oriented x3;  grossly normal neurologically. Skin:  Intact without significant lesions or rashes. Cervical Nodes:  No significant cervical adenopathy. Psych:  Alert and cooperative. Normal affect.  LAB RESULTS: Recent Labs    10/09/17 1837 10/10/17 0310  WBC 26.0* 16.8*  HGB 18.0 17.9  HCT 51.0 51.5  PLT 289 212   BMET Recent Labs    10/09/17 1837 10/09/17 2350 10/10/17 0310  NA 140 141 140  K 3.8 4.6 5.3*  CL 102 111 110  CO2 22 16* 22  GLUCOSE 328* 266* 290*  BUN 15 13 12   CREATININE 1.08 0.92 0.94  CALCIUM 9.8 8.1* 8.4*   LFT Recent Labs    10/09/17 1837  PROT 7.6  ALBUMIN 4.9  AST 25  ALT 23  ALKPHOS 60  BILITOT 1.0   PT/INR No results for input(s): LABPROT, INR in the last 72 hours.  STUDIES: Ct Abdomen Pelvis W Contrast  Result Date: 10/09/2017 CLINICAL DATA:  Generalized abdominal pain with nausea and vomiting EXAM: CT ABDOMEN AND PELVIS WITH CONTRAST TECHNIQUE: Multidetector CT imaging of the abdomen and pelvis was performed using the standard protocol following bolus administration of intravenous contrast. CONTRAST:  ISOVUE-300 IOPAMIDOL (ISOVUE-300) INJECTION 61% COMPARISON:  None. FINDINGS: Lower chest: Lung bases demonstrate no acute consolidation or effusion. The heart size is within normal limits. Hepatobiliary: No focal liver abnormality is seen. No gallstones, gallbladder wall thickening, or biliary dilatation. Pancreas: Large amount of peripancreatic inflammation and fluid consistent with acute pancreatitis. There is significant fatty replacement of the pancreas. There are areas of hypoenhancement at the pancreatic head and neck. No organized fluid collection. Spleen: Normal in size without focal abnormality. Adrenals/Urinary Tract: Adrenal glands are normal. Cyst in the upper pole right kidney. No hydronephrosis. Bladder unremarkable Stomach/Bowel: Stomach is within normal limits. Appendix is not well seen but no right lower quadrant  inflammation. Sigmoid colon diverticular disease without acute inflammatory process. No evidence of bowel wall thickening, distention, or inflammatory changes. Vascular/Lymphatic: Nonaneurysmal aorta. Moderate aortic atherosclerosis. No significantly enlarged lymph nodes. Normal portal vein and splenic vein enhancement. Reproductive: Prostate is unremarkable. Other: No free air. Moderate fluid within the right greater than left paracolic gutters. Musculoskeletal: No acute or significant osseous findings. IMPRESSION: 1. Moderate severe inflammatory change and fluid centered around the pancreas, consistent with acute pancreatitis. No organized fluid collections at this time. Mild hypoenhancement of the head and neck of the pancreas, possible early necrosis changes. 2. Sigmoid colon diverticular disease without acute inflammatory change. Electronically Signed   By: Jasmine Pang M.D.   On: 10/09/2017 21:40      Impression / Plan:   Jesse Mays is a 62 y.o. y/o male with acute pancreatitis with possible early necrosis changes on imaging  Patient reports daily alcohol use, and denies any increase in his usual, 1 beer daily intake Pancreatitis  likely related to alcohol No evidence of biliary obstruction  Triglyceride level ordered.  Please follow-up and treat for hypertriglyceridemia if present.  Other etiologies such as medications are also possibility Patient is on simvastatin which is considered a class I drug for drug-induced pancreatitis.  Consider holding the medication.  Lisinopril and metformin are considered class III drugs and are thus likely to be less of a culprit.  Management includes IV fluids, pain medications Would recommend lactated Ringer's at 250 to 300 cc an hour for the first 24 to 48 hours Patient would like to drink water by mouth today.  Clear liquid diet okay as long as patient tolerates.  If patient has increasing abdominal pain after clear liquid intake, please  make him n.p.o.  Can advance to low-fat diet in 1 to 2 days depending on patient tolerance  There is no walled off fluid collection to drain at this time.  If patient's clinical condition worsens, low threshold for transfer to the ICU, surgery evaluation and possible transfer to tertiary care center.  Dr. Tobi Bastos is on call over the weekend and patient was signed out to him.  He will be following the patient, please page him with any concerns or questions.   Thank you for involving me in the care of this patient.      LOS: 1 day   Pasty Spillers, MD  10/10/2017, 12:27 PM

## 2017-10-10 NOTE — Progress Notes (Signed)
Nutrition Brief Note  Patient identified on the Malnutrition Screening Tool (MST) Report.  62 year old male who presented to the ED with abdominal pain and N/V/D. PMH significant for hypertension and diabetes. Pt was found to have pancreatitis.  Spoke with pt at bedside who reports mild nausea and moderate pain.  Pt states that he is thirsty but currently has "no interest in eating." RN providing pt with a small amount of water at time of visit.  Pt states that everything was fine until yesterday at lunchtime when he developed N/V/D. Pt reports that his appetite is typically "great" and that he normally eats 3 meals daily. Pt states that his weight fluctuates between 205-210 lbs and changes based on his stress levels from work.  NFPE completed. Findings are no muscle depletion, no fat depletion, and no edema.  Wt Readings from Last 15 Encounters:  10/09/17 95.3 kg  09/05/17 95.3 kg    Body mass index is 31.01 kg/m. Patient meets criteria for obesity class I based on current BMI.   Current diet order is NPO with likely diet advancement tomorrow. Labs and medications reviewed.   No nutrition interventions warranted at this time. If nutrition issues arise, please consult RD.    Earma Reading, MS, RD, LDN Inpatient Clinical Dietitian Pager: [phone] Weekend/After Hours: [phone]

## 2017-10-10 NOTE — ED Notes (Signed)
Attempted to call report to floor. RN unable to take report at this time. 

## 2017-10-10 NOTE — Progress Notes (Signed)
Inpatient Diabetes Program Recommendations  AACE/ADA: New Consensus Statement on Inpatient Glycemic Control (2015)  Target Ranges:  Prepandial:   less than 140 mg/dL      Peak postprandial:   less than 180 mg/dL (1-2 hours)      Critically ill patients:  140 - 180 mg/dL   Results for MATTIX, IMHOF (MRN [id number]) as of 10/10/2017 09:15  Ref. Range 10/09/2017 20:59 10/09/2017 22:03 10/09/2017 23:07 10/10/2017 00:07 10/10/2017 01:43 10/10/2017 02:43 10/10/2017 07:59  Glucose-Capillary Latest Ref Range: 70 - 99 mg/dL 409 (H)  IV Insulin Drip 322 (H)  IV Insulin Drip 228 (H)  IV Insulin Drip 260 (H)  IV Insulin Drip 246 (H)  IV Insulin Drip OFF 258 (H) 251 (H)  5 units NOVOLOG    Admit with: Pancreatitis with possible early necrosis/ Severe Sepsis  History: DM  Home DM Meds: Metformin 1000 mg BID  Current Orders: Novolog Sensitive Correction Scale/ SSI (0-9 units) TID AC + HS     Currently NPO.  CBGs >200 mg/dl.    MD- Please consider the following in-hospital insulin adjustments:  1. Start basal insulin: Lantus 10 units Daily (0.1 units/kg dosing based on weight of 95 kg)--Please start this AM  2. Change/Increase Novolog SSI to Moderate Scale (0-15 units) Q4 hours (currently ordered Sensitive scale tid ac + hs)     --Will follow patient during hospitalization--  Ambrose Finland RN, MSN, CDE Diabetes Coordinator Inpatient Glycemic Control Team Team Pager: [phone] (8a-5p)

## 2017-10-10 NOTE — Progress Notes (Signed)
[facility] [place] at [facility]   PATIENT NAME: Jesse Mays    MR#:  [id number]  DATE OF BIRTH:  10/25/1955  SUBJECTIVE:  patient came in with acute onset abdominal pain and found to have acute pancreatitis with elevated sugars and blood pressure. He states he feels a little better however overall feels rough. Rates his pain 4/10 blood pressure been on the higher side. Thirsty wants to drink a little bit of water.  REVIEW OF SYSTEMS:   Review of Systems  Constitutional: Negative for chills, fever and weight loss.  HENT: Negative for ear discharge, ear pain and nosebleeds.   Eyes: Negative for blurred vision, pain and discharge.  Respiratory: Negative for sputum production, shortness of breath, wheezing and stridor.   Cardiovascular: Negative for chest pain, palpitations, orthopnea and PND.  Gastrointestinal: Positive for abdominal pain. Negative for diarrhea, nausea and vomiting.  Genitourinary: Negative for frequency and urgency.  Musculoskeletal: Negative for back pain and joint pain.  Neurological: Negative for sensory change, speech change, focal weakness and weakness.  Psychiatric/Behavioral: Negative for depression and hallucinations. The patient is not nervous/anxious.    Tolerating Diet:npo Tolerating PT: ambulatory  DRUG ALLERGIES:  No Known Allergies  VITALS:  Blood pressure (!) 177/106, pulse 99, temperature 98.9 F (37.2 C), temperature source Oral, resp. rate (!) 30, height 5\' 9"  (1.753 m), weight 95.3 kg, SpO2 99 %.  PHYSICAL EXAMINATION:   Physical Exam  GENERAL:  62 y.o.-year-old patient lying in the bed with no acute distress.  EYES: Pupils equal, round, reactive to light and accommodation. No scleral icterus. Extraocular muscles intact.  HEENT: Head atraumatic, normocephalic. Oropharynx and nasopharynx clear.  NECK:  Supple, no jugular venous distention. No thyroid enlargement, no tenderness.  LUNGS: Normal breath  sounds bilaterally, no wheezing, rales, rhonchi. No use of accessory muscles of respiration.  CARDIOVASCULAR: S1, S2 normal. No murmurs, rubs, or gallops.  ABDOMEN: Soft, ++ epigastric mid abdomen tender, nondistended. Bowel sounds present. No organomegaly or mass.  EXTREMITIES: No cyanosis, clubbing or edema b/l.    NEUROLOGIC: Cranial nerves II through XII are intact. No focal Motor or sensory deficits b/l.   PSYCHIATRIC:  patient is alert and oriented x 3.  SKIN: No obvious rash, lesion, or ulcer.   LABORATORY PANEL:  CBC Recent Labs  Lab 10/10/17 0310  WBC 16.8*  HGB 17.9  HCT 51.5  PLT 212    Chemistries  Recent Labs  Lab 10/09/17 1837  10/10/17 0310  NA 140   < > 140  K 3.8   < > 5.3*  CL 102   < > 110  CO2 22   < > 22  GLUCOSE 328*   < > 290*  BUN 15   < > 12  CREATININE 1.08   < > 0.94  CALCIUM 9.8   < > 8.4*  AST 25  --   --   ALT 23  --   --   ALKPHOS 60  --   --   BILITOT 1.0  --   --    < > = values in this interval not displayed.   Cardiac Enzymes No results for input(s): TROPONINI in the last 168 hours. RADIOLOGY:  Ct Abdomen Pelvis W Contrast  Result Date: 10/09/2017 CLINICAL DATA:  Generalized abdominal pain with nausea and vomiting EXAM: CT ABDOMEN AND PELVIS WITH CONTRAST TECHNIQUE: Multidetector CT imaging of the abdomen and pelvis was performed using the standard protocol following bolus administration of intravenous  contrast. CONTRAST:  ISOVUE-300 IOPAMIDOL (ISOVUE-300) INJECTION 61% COMPARISON:  None. FINDINGS: Lower chest: Lung bases demonstrate no acute consolidation or effusion. The heart size is within normal limits. Hepatobiliary: No focal liver abnormality is seen. No gallstones, gallbladder wall thickening, or biliary dilatation. Pancreas: Large amount of peripancreatic inflammation and fluid consistent with acute pancreatitis. There is significant fatty replacement of the pancreas. There are areas of hypoenhancement at the pancreatic head  and neck. No organized fluid collection. Spleen: Normal in size without focal abnormality. Adrenals/Urinary Tract: Adrenal glands are normal. Cyst in the upper pole right kidney. No hydronephrosis. Bladder unremarkable Stomach/Bowel: Stomach is within normal limits. Appendix is not well seen but no right lower quadrant inflammation. Sigmoid colon diverticular disease without acute inflammatory process. No evidence of bowel wall thickening, distention, or inflammatory changes. Vascular/Lymphatic: Nonaneurysmal aorta. Moderate aortic atherosclerosis. No significantly enlarged lymph nodes. Normal portal vein and splenic vein enhancement. Reproductive: Prostate is unremarkable. Other: No free air. Moderate fluid within the right greater than left paracolic gutters. Musculoskeletal: No acute or significant osseous findings. IMPRESSION: 1. Moderate severe inflammatory change and fluid centered around the pancreas, consistent with acute pancreatitis. No organized fluid collections at this time. Mild hypoenhancement of the head and neck of the pancreas, possible early necrosis changes. 2. Sigmoid colon diverticular disease without acute inflammatory change. Electronically Signed   By: Jasmine Pang M.D.   On: 10/09/2017 21:40   ASSESSMENT AND PLAN:   Jesse Mays is a 62 y.o. male with a history of HTN, DM, presenting with abdominal pain, nausea vomiting and diarrhea.  The patient reports that at 230 this afternoon, he had acute onset of nausea vomiting and nonbloody diarrhea with associated abdominal pain.  * Severe sepsis (HCC) -likely related to his pancreatitis has no other clear source was elucidated on work-up. -IV Meropenem -  His lactic acid was significantly elevated, aggressive IV fluid resuscitation  -blood culture so far negative -BC 16.8 -LA 4.3--2.3  *Acute severe  Pancreatitis  --keep patient n.p.o. for now, other treatment as above -G.I. consulted  -IV PO and PRN pain meds  *   Diabetes (HCC) -significantly elevated.  The patient's anion gap on initial BMP was elevated, but barely so at 16.  He was started on insulin drip by ED physician -patient is anion gap close. Will start on Lantus 10 units Q day along with sliding scale. He still NPO.   *Uncontrolled   HTN (hypertension) -continue home dose antihypertensives.  I suspect some of his elevated blood pressure is due to discomfort from pain related to his pancreatitis.  -Increase plendil to 10 mg, start metoprolol 25 mg TID, IV hydralazine PRN  *DVT prophylaxis subcu lovenox Case discussed with Care Management/Social Worker. Management plans discussed with the patient, family and they are in agreement.  CODE STATUS: full  DVT Prophylaxis: lovenox  TOTAL TIME TAKING CARE OF THIS PATIENT: *30* minutes.  >50% time spent on counselling and coordination of care  POSSIBLE D/C IN few* DAYS, DEPENDING ON CLINICAL CONDITION.  Note: This dictation was prepared with Dragon dictation along with smaller phrase technology. Any transcriptional errors that result from this process are unintentional.  Enedina Finner M.D on 10/10/2017 at 12:37 PM  Between 7am to 6pm - Pager - [phone]  After 6pm go to [email or web address] - Social research officer, government  [facility]  Office  [phone]  CC: Primary care physician; Mickey Farber, MDPatient ID: [name], male   DOB: November 26, 1955, 62 y.o.  MRN: [id number]

## 2017-10-10 NOTE — ED Notes (Signed)
MD Anne Hahn informed of patient's heart rate and blood pressure. MD reported that he would order labetalol. MD reports that patient is to be admitted to Medsurg with these vital signs.

## 2017-10-11 DIAGNOSIS — K859 Acute pancreatitis without necrosis or infection, unspecified: Secondary | ICD-10-CM

## 2017-10-11 LAB — GLUCOSE, CAPILLARY
Glucose-Capillary: 190 mg/dL — ABNORMAL HIGH (ref 70–99)
Glucose-Capillary: 200 mg/dL — ABNORMAL HIGH (ref 70–99)
Glucose-Capillary: 180 mg/dL — ABNORMAL HIGH (ref 70–99)
Glucose-Capillary: 154 mg/dL — ABNORMAL HIGH (ref 70–99)

## 2017-10-11 LAB — CBC
HEMATOCRIT: 45.6 % (ref 40.0–52.0)
HEMOGLOBIN: 15.3 g/dL (ref 13.0–18.0)
MCH: 31.4 pg (ref 26.0–34.0)
MCHC: 33.6 g/dL (ref 32.0–36.0)
MCV: 93.6 fL (ref 80.0–100.0)
Platelets: 146 10*3/uL — ABNORMAL LOW (ref 150–440)
RBC: 4.88 MIL/uL (ref 4.40–5.90)
RDW: 13.1 % (ref 11.5–14.5)
WBC: 19.6 10*3/uL — AB (ref 3.8–10.6)

## 2017-10-11 LAB — URINE CULTURE: Culture: NO GROWTH

## 2017-10-11 LAB — HIV ANTIBODY (ROUTINE TESTING W REFLEX): HIV Screen 4th Generation wRfx: NONREACTIVE

## 2017-10-11 LAB — LIPASE, BLOOD: LIPASE: 290 U/L — AB (ref 11–51)

## 2017-10-11 NOTE — Plan of Care (Signed)
Clear liquid diet. The patient is being monitored at this time. GI consult ordered. The have seen the patient. Lab work will be monitored for tomorrow morning. PRN pain meds provided to help decrease pain levels.  Problem: Education: Goal: Knowledge of General Education information will improve Description Including pain rating scale, medication(s)/side effects and non-pharmacologic comfort measures Outcome: Progressing   Problem: Health Behavior/Discharge Planning: Goal: Ability to manage health-related needs will improve Outcome: Progressing   Problem: Clinical Measurements: Goal: Ability to maintain clinical measurements within normal limits will improve Outcome: Progressing Goal: Will remain free from infection Outcome: Progressing Goal: Diagnostic test results will improve Outcome: Progressing Goal: Respiratory complications will improve Outcome: Progressing Goal: Cardiovascular complication will be avoided Outcome: Progressing   Problem: Activity: Goal: Risk for activity intolerance will decrease Outcome: Progressing   Problem: Nutrition: Goal: Adequate nutrition will be maintained Outcome: Progressing   Problem: Coping: Goal: Level of anxiety will decrease Outcome: Progressing   Problem: Elimination: Goal: Will not experience complications related to bowel motility Outcome: Progressing Goal: Will not experience complications related to urinary retention Outcome: Progressing   Problem: Pain Managment: Goal: General experience of comfort will improve Outcome: Progressing   Problem: Safety: Goal: Ability to remain free from injury will improve Outcome: Progressing   Problem: Skin Integrity: Goal: Risk for impaired skin integrity will decrease Outcome: Progressing

## 2017-10-11 NOTE — Progress Notes (Signed)
[facility] - [place] at [facility]   PATIENT NAME: Jesse Mays    MR#:  [id number]  DATE OF BIRTH:  [birth year]  SUBJECTIVE:  patient came in with acute onset abdominal pain and found to have acute pancreatitis with elevated sugars and blood pressure. He states he feels a little better however overall feels rough. Rates his pain 4/10   REVIEW OF SYSTEMS:   Review of Systems  Constitutional: Negative for chills, fever and weight loss.  HENT: Negative for ear discharge, ear pain and nosebleeds.   Eyes: Negative for blurred vision, pain and discharge.  Respiratory: Negative for sputum production, shortness of breath, wheezing and stridor.   Cardiovascular: Negative for chest pain, palpitations, orthopnea and PND.  Gastrointestinal: Positive for abdominal pain. Negative for diarrhea, nausea and vomiting.  Genitourinary: Negative for frequency and urgency.  Musculoskeletal: Negative for back pain and joint pain.  Neurological: Negative for sensory change, speech change, focal weakness and weakness.  Psychiatric/Behavioral: Negative for depression and hallucinations. The patient is not nervous/anxious.    Tolerating Diet:npo Tolerating PT: ambulatory  DRUG ALLERGIES:  No Known Allergies  VITALS:  Blood pressure (!) 159/82, pulse (!) 104, temperature 98.5 F (36.9 C), temperature source Oral, resp. rate (!) 26, height 5\' 9"  (1.753 m), weight 95.3 kg, SpO2 91 %.  PHYSICAL EXAMINATION:   Physical Exam  GENERAL:  62 y.o.-year-old patient lying in the bed with no acute distress.  EYES: Pupils equal, round, reactive to light and accommodation. No scleral icterus. Extraocular muscles intact.  HEENT: Head atraumatic, normocephalic. Oropharynx and nasopharynx clear.  NECK:  Supple, no jugular venous distention. No thyroid enlargement, no tenderness.  LUNGS: Normal breath sounds bilaterally, no wheezing, rales, rhonchi. No use of accessory muscles of  respiration.  CARDIOVASCULAR: S1, S2 normal. No murmurs, rubs, or gallops.  ABDOMEN: Soft, ++ epigastric mid abdomen tender, nondistended. Bowel sounds present. No organomegaly or mass.  EXTREMITIES: No cyanosis, clubbing or edema b/l.    NEUROLOGIC: Cranial nerves II through XII are intact. No focal Motor or sensory deficits b/l.   PSYCHIATRIC:  patient is alert and oriented x 3.  SKIN: No obvious rash, lesion, or ulcer.   LABORATORY PANEL:  CBC Recent Labs  Lab 10/11/17 0500  WBC 19.6*  HGB 15.3  HCT 45.6  PLT 146*    Chemistries  Recent Labs  Lab 10/09/17 1837  10/10/17 0310  NA 140   < > 140  K 3.8   < > 5.3*  CL 102   < > 110  CO2 22   < > 22  GLUCOSE 328*   < > 290*  BUN 15   < > 12  CREATININE 1.08   < > 0.94  CALCIUM 9.8   < > 8.4*  AST 25  --   --   ALT 23  --   --   ALKPHOS 60  --   --   BILITOT 1.0  --   --    < > = values in this interval not displayed.   Cardiac Enzymes No results for input(s): TROPONINI in the last 168 hours. RADIOLOGY:  Ct Abdomen Pelvis W Contrast  Result Date: 10/09/2017 CLINICAL DATA:  Generalized abdominal pain with nausea and vomiting EXAM: CT ABDOMEN AND PELVIS WITH CONTRAST TECHNIQUE: Multidetector CT imaging of the abdomen and pelvis was performed using the standard protocol following bolus administration of intravenous contrast. CONTRAST:  ISOVUE-300 IOPAMIDOL (ISOVUE-300) INJECTION 61% COMPARISON:  None. FINDINGS: Lower  chest: Lung bases demonstrate no acute consolidation or effusion. The heart size is within normal limits. Hepatobiliary: No focal liver abnormality is seen. No gallstones, gallbladder wall thickening, or biliary dilatation. Pancreas: Large amount of peripancreatic inflammation and fluid consistent with acute pancreatitis. There is significant fatty replacement of the pancreas. There are areas of hypoenhancement at the pancreatic head and neck. No organized fluid collection. Spleen: Normal in size without focal  abnormality. Adrenals/Urinary Tract: Adrenal glands are normal. Cyst in the upper pole right kidney. No hydronephrosis. Bladder unremarkable Stomach/Bowel: Stomach is within normal limits. Appendix is not well seen but no right lower quadrant inflammation. Sigmoid colon diverticular disease without acute inflammatory process. No evidence of bowel wall thickening, distention, or inflammatory changes. Vascular/Lymphatic: Nonaneurysmal aorta. Moderate aortic atherosclerosis. No significantly enlarged lymph nodes. Normal portal vein and splenic vein enhancement. Reproductive: Prostate is unremarkable. Other: No free air. Moderate fluid within the right greater than left paracolic gutters. Musculoskeletal: No acute or significant osseous findings. IMPRESSION: 1. Moderate severe inflammatory change and fluid centered around the pancreas, consistent with acute pancreatitis. No organized fluid collections at this time. Mild hypoenhancement of the head and neck of the pancreas, possible early necrosis changes. 2. Sigmoid colon diverticular disease without acute inflammatory change. Electronically Signed   By: Jasmine Pang M.D.   On: 10/09/2017 21:40   ASSESSMENT AND PLAN:   Jesse Mays is a 63 y.o. male with a history of HTN, DM, presenting with abdominal pain, nausea vomiting and diarrhea.  The patient reports that at 230 this afternoon, he had acute onset of nausea vomiting and nonbloody diarrhea with associated abdominal pain.  * Severe sepsis (HCC) -likely related to his pancreatitis has no other clear source was elucidated on work-up. -IV Meropenem - aggressive IV fluid resuscitation  -blood culture and urine cultures are so far negative -BC 16.8 -LA 4.3--2.3 -WBC 16.8-19.6  *Acute severe  Pancreatitis -unclear etiology -Clear liquid diet -G.I. consulted -seen by Dr. Tobi Bastos who is recommending IV fluids and-IV PO and PRN pain meds -Check a.m. labs and IgG4 -GI is recommending MRCP as an  outpatient to rule out any underlying pancreatic nodules or tumors that could have precipitated the pancreatitis  *  Diabetes (HCC) -significantly elevated.  The patient's anion gap on initial BMP was elevated, but barely so at 16.   He was started on insulin drip by ED physician and clinically improved -Patient is started on Lantus 10 units Q day along with sliding scale.  Clear liquid  *Uncontrolled   HTN (hypertension) -continue home dose antihypertensives.  I suspect some of his elevated blood pressure is due to discomfort from pain related to his pancreatitis.  -Increase plendil to 10 mg, start metoprolol 25 mg TID, IV hydralazine PRN  *DVT prophylaxis subcu lovenox Case discussed with Care Management/Social Worker. Management plans discussed with the patient, family and they are in agreement.  CODE STATUS: full  DVT Prophylaxis: lovenox  TOTAL TIME TAKING CARE OF THIS PATIENT: *33* minutes.  >50% time spent on counselling and coordination of care  POSSIBLE D/C IN 1-2 DAYS, DEPENDING ON CLINICAL CONDITION.  Note: This dictation was prepared with Dragon dictation along with smaller phrase technology. Any transcriptional errors that result from this process are unintentional.  Ramonita Lab M.D on 10/11/2017 at 2:27 PM  Between 7am to 6pm - Pager - [phone]  After 6pm go to [email or web address] - Social research officer, government  [facility]  Office  [phone]  CC: Primary care physician; Collins,  Onalee Hua, MDPatient ID: Jesse Mays, male   DOB: 1955/04/04, 62 y.o.   MRN: [id number]

## 2017-10-11 NOTE — Progress Notes (Signed)
Wyline Mood , MD [address], [place], [place], [address] [address]40 [address], [address], [place], [place], [address] Phone: [phone]  Fax: [phone]   Jesse Mays is being followed for acute pancreatitis  Day 1 of follow up   Subjective: Feels better, minimal abdominal discomfort    Objective: Vital signs in last 24 hours: Vitals:   10/11/17 0427 10/11/17 0824 10/11/17 0926 10/11/17 0928  BP: (!) 163/92 (!) 196/101 (!) 182/97 (!) 175/94  Pulse: (!) 111 (!) 116 (!) 102 (!) 102  Resp: 16 16 17    Temp: 99.5 F (37.5 C) 99 F (37.2 C)    TempSrc: Oral Oral    SpO2: 93% 95% 93% 93%  Weight:      Height:       Weight change:   Intake/Output Summary (Last 24 hours) at 10/11/2017 1132 Last data filed at 10/11/2017 0700 Gross per 24 hour  Intake -  Output 875 ml  Net -875 ml     Exam: Heart:: Regular rate and rhythm, S1S2 present or without murmur or extra heart sounds Lungs: normal, clear to auscultation and clear to auscultation and percussion Abdomen: soft, nontender, normal bowel sounds   Lab Results: @LABTEST2 @ Micro Results: Recent Results (from the past 240 hour(s))  Blood Culture (routine x 2)     Status: None (Preliminary result)   Collection Time: 10/09/17  7:49 PM  Result Value Ref Range Status   Specimen Description BLOOD RIGHT ANTECUBITAL  Final   Special Requests   Final    BOTTLES DRAWN AEROBIC AND ANAEROBIC Blood Culture results may not be optimal due to an excessive volume of blood received in culture bottles   Culture   Final    NO GROWTH 2 DAYS Performed at [facility], [address] [place], [place] [address]    Report Status PENDING  Incomplete  Blood Culture (routine x 2)     Status: None (Preliminary result)   Collection Time: 10/09/17  7:49 PM  Result Value Ref Range Status   Specimen Description BLOOD BLOOD LEFT FOREARM  Final   Special Requests   Final    BOTTLES DRAWN AEROBIC AND ANAEROBIC Blood  Culture results may not be optimal due to an inadequate volume of blood received in culture bottles   Culture   Final    NO GROWTH 2 DAYS Performed at [facility], [address] [place], [place] [address]    Report Status PENDING  Incomplete  Urine culture     Status: None   Collection Time: 10/09/17  8:51 PM  Result Value Ref Range Status   Specimen Description   Final    URINE, CLEAN CATCH Performed at [facility], [address] [place], [place] [address]    Special Requests   Final    NONE Performed at [facility], [address] [place], [place] [address]    Culture   Final    NO GROWTH Performed at [facility], [address], [place], [place] [address]    Report Status 10/11/2017 FINAL  Final   Studies/Results: Ct Abdomen Pelvis W Contrast  Result Date: 10/09/2017 CLINICAL DATA:  Generalized abdominal pain with nausea and vomiting EXAM: CT ABDOMEN AND PELVIS WITH CONTRAST TECHNIQUE: Multidetector CT imaging of the abdomen and pelvis was performed using the standard protocol following bolus administration of intravenous contrast. CONTRAST:  ISOVUE-300 IOPAMIDOL (ISOVUE-300) INJECTION 61% COMPARISON:  None. FINDINGS: Lower chest: Lung bases  demonstrate no acute consolidation or effusion. The heart size is within normal limits. Hepatobiliary: No focal liver abnormality is seen. No gallstones, gallbladder wall thickening, or biliary dilatation. Pancreas: Large amount of peripancreatic inflammation and fluid consistent with acute pancreatitis. There is significant fatty replacement of the pancreas. There are areas of hypoenhancement at the pancreatic head and neck. No organized fluid collection. Spleen: Normal in size without focal abnormality. Adrenals/Urinary Tract: Adrenal glands are normal. Cyst in the upper pole right kidney. No hydronephrosis. Bladder unremarkable Stomach/Bowel: Stomach is within normal limits. Appendix is not well  seen but no right lower quadrant inflammation. Sigmoid colon diverticular disease without acute inflammatory process. No evidence of bowel wall thickening, distention, or inflammatory changes. Vascular/Lymphatic: Nonaneurysmal aorta. Moderate aortic atherosclerosis. No significantly enlarged lymph nodes. Normal portal vein and splenic vein enhancement. Reproductive: Prostate is unremarkable. Other: No free air. Moderate fluid within the right greater than left paracolic gutters. Musculoskeletal: No acute or significant osseous findings. IMPRESSION: 1. Moderate severe inflammatory change and fluid centered around the pancreas, consistent with acute pancreatitis. No organized fluid collections at this time. Mild hypoenhancement of the head and neck of the pancreas, possible early necrosis changes. 2. Sigmoid colon diverticular disease without acute inflammatory change. Electronically Signed   By: Jasmine Pang M.D.   On: 10/09/2017 21:40   Medications: I have reviewed the patient's current medications. Scheduled Meds: . aspirin EC  81 mg Oral Daily  . enoxaparin (LOVENOX) injection  40 mg Subcutaneous Q24H  . felodipine  10 mg Oral Daily  . insulin aspart  0-5 Units Subcutaneous QHS  . insulin aspart  0-9 Units Subcutaneous TID WC  . insulin glargine  10 Units Subcutaneous Daily  . lidocaine  1 patch Transdermal Q24H  . lisinopril  40 mg Oral Daily  . metoprolol tartrate  25 mg Oral TID   Continuous Infusions: . lactated ringers 150 mL/hr at 10/11/17 0909  . meropenem (MERREM) IV 1 g (10/11/17 0922)   PRN Meds:.acetaminophen **OR** acetaminophen, hydrALAZINE, HYDROmorphone (DILAUDID) injection, ondansetron **OR** ondansetron (ZOFRAN) IV, oxyCODONE   Assessment: Principal Problem:   Severe sepsis (HCC) Active Problems:   Diabetes (HCC)   HTN (hypertension)   Pancreatitis  Jesse Mays 62 y.o. male admitted with acute alcoholic pancreatitis . LFT's,TGL normal . CT abdomen showed no  biliary dilation , no gall stones .Possible early necrosis on CT scan .   Plan: 1. IV fluids , analgesia,   2. Check IGG4  3. Check LFT's tomorrow   4. MRCP as an outpatient to r/o any underlying pancreatic nodules of tumor that could have precipitated the pancreatitis.   5. Stop all alcohol.    LOS: 2 days   Wyline Mood, MD 10/11/2017, 11:32 AM

## 2017-10-12 ENCOUNTER — Inpatient Hospital Stay: Payer: BC Managed Care – PPO

## 2017-10-12 DIAGNOSIS — R17 Unspecified jaundice: Secondary | ICD-10-CM

## 2017-10-12 LAB — GLUCOSE, CAPILLARY
Glucose-Capillary: 166 mg/dL — ABNORMAL HIGH (ref 70–99)
Glucose-Capillary: 180 mg/dL — ABNORMAL HIGH (ref 70–99)
Glucose-Capillary: 169 mg/dL — ABNORMAL HIGH (ref 70–99)
Glucose-Capillary: 154 mg/dL — ABNORMAL HIGH (ref 70–99)

## 2017-10-12 LAB — COMPREHENSIVE METABOLIC PANEL
ALBUMIN: 3.1 g/dL — AB (ref 3.5–5.0)
ALT: 18 U/L (ref 0–44)
AST: 29 U/L (ref 15–41)
Alkaline Phosphatase: 55 U/L (ref 38–126)
Anion gap: 10 (ref 5–15)
BILIRUBIN TOTAL: 3.9 mg/dL — AB (ref 0.3–1.2)
BUN: 12 mg/dL (ref 8–23)
CHLORIDE: 101 mmol/L (ref 98–111)
CO2: 26 mmol/L (ref 22–32)
Calcium: 8.5 mg/dL — ABNORMAL LOW (ref 8.9–10.3)
Creatinine, Ser: 0.68 mg/dL (ref 0.61–1.24)
GFR calc Af Amer: >60 mL/min (ref >60)
Glucose, Bld: 185 mg/dL — ABNORMAL HIGH (ref 70–99)
Potassium: 3.6 mmol/L (ref 3.5–5.1)
Sodium: 137 mmol/L (ref 135–145)
Total Protein: 6.3 g/dL — ABNORMAL LOW (ref 6.5–8.1)

## 2017-10-12 LAB — BLOOD GAS, ARTERIAL
Acid-Base Excess: 2.3 mmol/L — ABNORMAL HIGH (ref 0.0–2.0)
Acid-Base Excess: 2.5 mmol/L — ABNORMAL HIGH (ref 0.0–2.0)
Bicarbonate: 26.5 mmol/L (ref 20.0–28.0)
Bicarbonate: 26.3 mmol/L (ref 20.0–28.0)
FIO2: 0.21
FIO2: 0.32
O2 Saturation: 85.1 %
O2 Saturation: 92.4 %
PATIENT TEMPERATURE: 37
PCO2 ART: 39 mmHg (ref 32.0–48.0)
PH ART: 7.46 — AB (ref 7.350–7.450)
Patient temperature: 37
pCO2 arterial: 37 mmHg (ref 32.0–48.0)
pH, Arterial: 7.44 (ref 7.350–7.450)
pO2, Arterial: 48 mmHg — ABNORMAL LOW (ref 83.0–108.0)
pO2, Arterial: 61 mmHg — ABNORMAL LOW (ref 83.0–108.0)

## 2017-10-12 LAB — BILIRUBIN, FRACTIONATED(TOT/DIR/INDIR)
BILIRUBIN INDIRECT: 2.7 mg/dL — AB (ref 0.3–0.9)
Bilirubin, Direct: 1.6 mg/dL — ABNORMAL HIGH (ref 0.0–0.2)
Total Bilirubin: 4.3 mg/dL — ABNORMAL HIGH (ref 0.3–1.2)

## 2017-10-12 LAB — CBC
HEMATOCRIT: 42.4 % (ref 40.0–52.0)
Hemoglobin: 14.2 g/dL (ref 13.0–18.0)
MCH: 31.3 pg (ref 26.0–34.0)
MCHC: 33.5 g/dL (ref 32.0–36.0)
MCV: 93.5 fL (ref 80.0–100.0)
Platelets: 147 10*3/uL — ABNORMAL LOW (ref 150–440)
RBC: 4.53 MIL/uL (ref 4.40–5.90)
RDW: 13.1 % (ref 11.5–14.5)
WBC: 20.1 10*3/uL — AB (ref 3.8–10.6)

## 2017-10-12 LAB — LIPASE, BLOOD: Lipase: 73 U/L — ABNORMAL HIGH (ref 11–51)

## 2017-10-12 MED ORDER — IPRATROPIUM-ALBUTEROL 0.5-2.5 (3) MG/3ML IN SOLN
3.0000 mL | Freq: Four times a day (QID) | RESPIRATORY_TRACT | Status: DC
  Administered 2017-10-13: 3 mL via RESPIRATORY_TRACT
  Filled 2017-10-12: qty 3

## 2017-10-12 MED ORDER — FUROSEMIDE 10 MG/ML IJ SOLN
40.0000 mg | Freq: Once | INTRAMUSCULAR | Status: AC
  Administered 2017-10-12: 40 mg via INTRAVENOUS
  Filled 2017-10-12: qty 4

## 2017-10-12 MED ORDER — RAMELTEON 8 MG PO TABS
8.0000 mg | ORAL_TABLET | Freq: Every day | ORAL | Status: DC
  Administered 2017-10-12 – 2017-10-13 (×3): 8 mg via ORAL
  Filled 2017-10-12 (×4): qty 1

## 2017-10-12 MED ORDER — FAMOTIDINE 20 MG PO TABS
20.0000 mg | ORAL_TABLET | Freq: Two times a day (BID) | ORAL | Status: DC
  Administered 2017-10-12 – 2017-10-15 (×7): 20 mg via ORAL
  Filled 2017-10-12 (×7): qty 1

## 2017-10-12 NOTE — Progress Notes (Signed)
Pt refused 2000 hr tx, stated he has no respiratory hx and does not need breathing tx

## 2017-10-12 NOTE — Progress Notes (Signed)
[facility] [place] at [facility]   PATIENT NAME: Jesse Mays    MR#:  [id number]  DATE OF BIRTH:  07-Aug-1955  SUBJECTIVE:  patient came in with acute onset abdominal pain and found to have acute pancreatitis with elevated sugars and blood pressure. Patient became short of breath today requiring oxygen for hypoxia  REVIEW OF SYSTEMS:   Review of Systems  Constitutional: Negative for chills, fever and weight loss.  HENT: Negative for ear discharge, ear pain and nosebleeds.   Eyes: Negative for blurred vision, pain and discharge.  Respiratory: Positive for shortness of breath. Negative for sputum production, wheezing and stridor.   Cardiovascular: Negative for chest pain, palpitations, orthopnea and PND.  Gastrointestinal: Positive for abdominal pain. Negative for diarrhea, nausea and vomiting.  Genitourinary: Negative for frequency and urgency.  Musculoskeletal: Negative for back pain and joint pain.  Neurological: Negative for sensory change, speech change, focal weakness and weakness.  Psychiatric/Behavioral: Negative for depression and hallucinations. The patient is not nervous/anxious.    Tolerating Diet:npo Tolerating PT: ambulatory  DRUG ALLERGIES:  No Known Allergies  VITALS:  Blood pressure (!) 169/97, pulse (!) 105, temperature 99.3 F (37.4 C), temperature source Oral, resp. rate 18, height 5\' 9"  (1.753 m), weight 95.3 kg, SpO2 96 %.  PHYSICAL EXAMINATION:   Physical Exam  GENERAL:  62 y.o.-year-old patient lying in the bed with no acute distress.  EYES: Pupils equal, round, reactive to light and accommodation. No scleral icterus. Extraocular muscles intact.  HEENT: Head atraumatic, normocephalic. Oropharynx and nasopharynx clear.  NECK:  Supple, no jugular venous distention. No thyroid enlargement, no tenderness.  LUNGS: Normal breath sounds bilaterally, no wheezing, rales, rhonchi. No use of accessory muscles of respiration.   CARDIOVASCULAR: S1, S2 normal. No murmurs, rubs, or gallops.  ABDOMEN: Soft, ++ epigastric mid abdomen tender, nondistended. Bowel sounds present. No organomegaly or mass.  EXTREMITIES: No cyanosis, clubbing or edema b/l.    NEUROLOGIC: Cranial nerves II through XII are intact. No focal Motor or sensory deficits b/l.   PSYCHIATRIC:  patient is alert and oriented x 3.  SKIN: No obvious rash, lesion, or ulcer.   LABORATORY PANEL:  CBC Recent Labs  Lab 10/12/17 0852  WBC 20.1*  HGB 14.2  HCT 42.4  PLT 147*    Chemistries  Recent Labs  Lab 10/12/17 0852 10/12/17 1518  NA 137  --   K 3.6  --   CL 101  --   CO2 26  --   GLUCOSE 185*  --   BUN 12  --   CREATININE 0.68  --   CALCIUM 8.5*  --   AST 29  --   ALT 18  --   ALKPHOS 55  --   BILITOT 3.9* 4.3*   Cardiac Enzymes No results for input(s): TROPONINI in the last 168 hours. RADIOLOGY:  Dg Chest 1 View  Result Date: 10/12/2017 CLINICAL DATA:  Shortness of breath EXAM: CHEST  1 VIEW COMPARISON:  None. FINDINGS: Cardiomegaly with vascular congestion. Small bilateral pleural effusions with bilateral lower lobe airspace opacities which could reflect edema or atelectasis. No acute bony abnormality. IMPRESSION: Cardiomegaly with vascular congestion. Bilateral lower lobe opacities could reflect edema or atelectasis. Small effusions. Electronically Signed   By: Charlett Nose M.D.   On: 10/12/2017 08:44   ASSESSMENT AND PLAN:   Jesse Mays is a 62 y.o. male with a history of HTN, DM, presenting with abdominal pain, nausea vomiting and diarrhea.  The patient reports that at 230 this afternoon, he had acute onset of nausea vomiting and nonbloody diarrhea with associated abdominal pain.  *Pulmonary edema from fluid overload DC IV fluids Lasix 40 mg IV x1 Echocardiogram Wean off oxygen as tolerated No history of congestive heart failure in the past  * Severe sepsis (HCC) -likely related to his pancreatitis has no other  clear source was elucidated on work-up. -IV Meropenem - aggressive IV fluid resuscitation  -blood culture and urine cultures are so far negative -BC 16.8 -LA 4.3--2.3 -WBC 16.8-19.6  *Acute severe  Pancreatitis -unclear etiology -Clear liquid diet advanced as tolerated-soft diet now -G.I. consulted -seen by Dr. Tobi Bastos  -Check a.m. labs and IgG4 -Total bilirubin is elevated , indirect bilirubin is elevated GI is recommending MRCP to rule out CBD stones, any underlying pancreatic nodules or tumors that could have precipitated the pancreatitis  *  Diabetes (HCC) -significantly elevated.  The patient's anion gap on initial BMP was elevated, but barely so at 16.   He was started on insulin drip by ED physician and clinically improved -Patient is started on Lantus 10 units Q day along with sliding scale.  Clear liquid  *Uncontrolled   HTN (hypertension) -continue home dose DC IV fluids as patient is fluid overloaded  antihypertensives.   -Increase plendil to 10 mg, start metoprolol 25 mg TID, IV hydralazine PRN  *DVT prophylaxis subcu lovenox Case discussed with Care Management/Social Worker. Management plans discussed with the patient, family and they are in agreement.  CODE STATUS: full  DVT Prophylaxis: lovenox  TOTAL TIME TAKING CARE OF THIS PATIENT: *33* minutes.  >50% time spent on counselling and coordination of care  POSSIBLE D/C IN 1-2 DAYS, DEPENDING ON CLINICAL CONDITION.  Note: This dictation was prepared with Dragon dictation along with smaller phrase technology. Any transcriptional errors that result from this process are unintentional.  Ramonita Lab M.D on 10/12/2017 at 10:38 PM  Between 7am to 6pm - Pager - [phone]  After 6pm go to [email or web address] - Social research officer, government  [facility]  Office  [phone]  CC: Primary care physician; Mickey Farber, MDPatient ID: Jesse Mays, male   DOB: [birth year], 62 y.o.   MRN: [id number]

## 2017-10-12 NOTE — Plan of Care (Signed)
Fluids stopped this morning. IV lasix given. Monitoring oxygen level and cardiac rhythm.  Problem: Education: Goal: Knowledge of General Education information will improve Description Including pain rating scale, medication(s)/side effects and non-pharmacologic comfort measures Outcome: Progressing   Problem: Health Behavior/Discharge Planning: Goal: Ability to manage health-related needs will improve Outcome: Progressing   Problem: Clinical Measurements: Goal: Ability to maintain clinical measurements within normal limits will improve Outcome: Progressing Goal: Will remain free from infection Outcome: Progressing Goal: Diagnostic test results will improve Outcome: Progressing Goal: Respiratory complications will improve Outcome: Progressing Goal: Cardiovascular complication will be avoided Outcome: Progressing   Problem: Activity: Goal: Risk for activity intolerance will decrease Outcome: Progressing   Problem: Nutrition: Goal: Adequate nutrition will be maintained Outcome: Progressing   Problem: Coping: Goal: Level of anxiety will decrease Outcome: Progressing   Problem: Elimination: Goal: Will not experience complications related to bowel motility Outcome: Progressing Goal: Will not experience complications related to urinary retention Outcome: Progressing   Problem: Pain Managment: Goal: General experience of comfort will improve Outcome: Progressing   Problem: Safety: Goal: Ability to remain free from injury will improve Outcome: Progressing   Problem: Skin Integrity: Goal: Risk for impaired skin integrity will decrease Outcome: Progressing

## 2017-10-12 NOTE — Progress Notes (Signed)
Wyline Mood , MD [address], [address], [place], [place], [address] [address], [address], [place], [place], [address] Phone: [phone]  Fax: [phone]   CONNIE HILGERT is being followed for Acute pancreatitis  Subjective: Feels much better minimal epigastric pain .   Objective: Vital signs in last 24 hours: Vitals:   10/12/17 0524 10/12/17 0811 10/12/17 1140 10/12/17 1142  BP: (!) 176/91 (!) 194/110 (!) 176/103 (!) 181/101  Pulse: (!) 106 (!) 114 (!) 112   Resp: 20 (!) 28 (!) 30   Temp: 99.1 F (37.3 C) 99.3 F (37.4 C) 98.7 F (37.1 C)   TempSrc: Oral Axillary Oral   SpO2: 93% 96% 96%   Weight:      Height:       Weight change:   Intake/Output Summary (Last 24 hours) at 10/12/2017 1215 Last data filed at 10/12/2017 1107 Gross per 24 hour  Intake 2367.01 ml  Output 2550 ml  Net -182.99 ml     Exam: Heart:: Regular rate and rhythm, S1S2 present or without murmur or extra heart sounds Lungs: normal, clear to auscultation and clear to auscultation and percussion Abdomen: soft, minimal epigastric tenderness, normal bowel sounds   Lab Results: @LABTEST2 @ Micro Results: Recent Results (from the past 240 hour(s))  Blood Culture (routine x 2)     Status: None (Preliminary result)   Collection Time: 10/09/17  7:49 PM  Result Value Ref Range Status   Specimen Description BLOOD RIGHT ANTECUBITAL  Final   Special Requests   Final    BOTTLES DRAWN AEROBIC AND ANAEROBIC Blood Culture results may not be optimal due to an excessive volume of blood received in culture bottles   Culture   Final    NO GROWTH 3 DAYS Performed at [facility], [address] [place], [place] [address]    Report Status PENDING  Incomplete  Blood Culture (routine x 2)     Status: None (Preliminary result)   Collection Time: 10/09/17  7:49 PM  Result Value Ref Range Status   Specimen Description BLOOD BLOOD LEFT FOREARM  Final   Special Requests   Final   BOTTLES DRAWN AEROBIC AND ANAEROBIC Blood Culture results may not be optimal due to an inadequate volume of blood received in culture bottles   Culture   Final    NO GROWTH 3 DAYS Performed at [facility], [address], [place], [place] [address]    Report Status PENDING  Incomplete  Urine culture     Status: None   Collection Time: 10/09/17  8:51 PM  Result Value Ref Range Status   Specimen Description   Final    URINE, CLEAN CATCH Performed at [facility], [address], [place], [place] [address]    Special Requests   Final    NONE Performed at [facility], [address], [place], [place] [address]    Culture   Final    NO GROWTH Performed at [facility], [address], [place], [place] [address]    Report Status 10/11/2017 FINAL  Final   Studies/Results: Dg Chest 1 View  Result Date: 10/12/2017 CLINICAL DATA:  Shortness of breath EXAM: CHEST  1 VIEW COMPARISON:  None. FINDINGS: Cardiomegaly with vascular congestion. Small bilateral pleural effusions with bilateral lower lobe airspace opacities which could reflect edema or atelectasis. No acute bony abnormality. IMPRESSION: Cardiomegaly with vascular congestion. Bilateral lower lobe opacities could reflect edema or atelectasis. Small effusions. Electronically  Signed   By: Charlett Nose M.D.   On: 10/12/2017 08:44   Medications: I have reviewed the patient's current medications. Scheduled Meds: . aspirin EC  81 mg Oral Daily  . enoxaparin (LOVENOX) injection  40 mg Subcutaneous Q24H  . famotidine  20 mg Oral BID  . felodipine  10 mg Oral Daily  . insulin aspart  0-5 Units Subcutaneous QHS  . insulin aspart  0-9 Units Subcutaneous TID WC  . insulin glargine  10 Units Subcutaneous Daily  . lidocaine  1 patch Transdermal Q24H  . lisinopril  40 mg Oral Daily  . metoprolol tartrate  25 mg Oral TID  . ramelteon  8 mg Oral QHS   Continuous Infusions: . meropenem (MERREM) IV 1 g  (10/12/17 0920)   PRN Meds:.acetaminophen **OR** acetaminophen, hydrALAZINE, HYDROmorphone (DILAUDID) injection, ondansetron **OR** ondansetron (ZOFRAN) IV, oxyCODONE   Assessment: Principal Problem:   Severe sepsis (HCC) Active Problems:   Diabetes (HCC)   HTN (hypertension)   Pancreatitis   AGUSTINE ROSSITTO 62 y.o. male admitted with acute alcoholic pancreatitis . LFT's,TGL normal . CT abdomen showed no biliary dilation , no gall stones .Possible early necrosis on CT scan . Overnight X ray shows B/l lower lobe opacities or edema.  T bilirubin elevated today  Plan: 1. IV fluids , analgesia, advance diet gradually as tolerated  2. Check IGG4, f/u fractionated biliurbin if predominently direct then will need MRCP to r/o CBD stones,   3. Check LFT's tomorrow    4. MRCP as an outpatient to r/o any underlying pancreatic nodules of tumor that could have precipitated the pancreatitis.   5. Stop all alcohol.     LOS: 3 days   Wyline Mood, MD 10/12/2017, 12:15 PM

## 2017-10-13 ENCOUNTER — Inpatient Hospital Stay (HOSPITAL_COMMUNITY)
Admit: 2017-10-13 | Discharge: 2017-10-13 | Disposition: A | Discharge status: Home / Self Care | Payer: BC Managed Care – PPO | Attending: Internal Medicine | Admitting: Internal Medicine

## 2017-10-13 ENCOUNTER — Inpatient Hospital Stay: Payer: BC Managed Care – PPO

## 2017-10-13 DIAGNOSIS — I517 Cardiomegaly: Secondary | ICD-10-CM

## 2017-10-13 LAB — COMPREHENSIVE METABOLIC PANEL
ALT: 30 U/L (ref 0–44)
ANION GAP: 9 (ref 5–15)
AST: 39 U/L (ref 15–41)
Albumin: 2.9 g/dL — ABNORMAL LOW (ref 3.5–5.0)
Alkaline Phosphatase: 65 U/L (ref 38–126)
BILIRUBIN TOTAL: 3.8 mg/dL — AB (ref 0.3–1.2)
BUN: 14 mg/dL (ref 8–23)
CHLORIDE: 101 mmol/L (ref 98–111)
CO2: 27 mmol/L (ref 22–32)
Calcium: 8.5 mg/dL — ABNORMAL LOW (ref 8.9–10.3)
Creatinine, Ser: 0.72 mg/dL (ref 0.61–1.24)
GFR calc Af Amer: >60 mL/min (ref >60)
Glucose, Bld: 181 mg/dL — ABNORMAL HIGH (ref 70–99)
POTASSIUM: 3.4 mmol/L — AB (ref 3.5–5.1)
Sodium: 137 mmol/L (ref 135–145)
TOTAL PROTEIN: 6.3 g/dL — AB (ref 6.5–8.1)

## 2017-10-13 LAB — CBC
HEMATOCRIT: 39.6 % — AB (ref 40.0–52.0)
HEMOGLOBIN: 13.4 g/dL (ref 13.0–18.0)
MCH: 31.6 pg (ref 26.0–34.0)
MCHC: 33.9 g/dL (ref 32.0–36.0)
MCV: 93.1 fL (ref 80.0–100.0)
Platelets: 172 10*3/uL (ref 150–440)
RBC: 4.26 MIL/uL — AB (ref 4.40–5.90)
RDW: 12.8 % (ref 11.5–14.5)
WBC: 14.8 10*3/uL — AB (ref 3.8–10.6)

## 2017-10-13 LAB — LIPASE, BLOOD: LIPASE: 36 U/L (ref 11–51)

## 2017-10-13 LAB — GLUCOSE, CAPILLARY
Glucose-Capillary: 159 mg/dL — ABNORMAL HIGH (ref 70–99)
Glucose-Capillary: 170 mg/dL — ABNORMAL HIGH (ref 70–99)
Glucose-Capillary: 215 mg/dL — ABNORMAL HIGH (ref 70–99)
Glucose-Capillary: 156 mg/dL — ABNORMAL HIGH (ref 70–99)

## 2017-10-13 LAB — ECHOCARDIOGRAM COMPLETE
HEIGHTINCHES: 69 in
Weight: 3360 oz

## 2017-10-13 LAB — BILIRUBIN, DIRECT: Bilirubin, Direct: 1.6 mg/dL — ABNORMAL HIGH (ref 0.0–0.2)

## 2017-10-13 MED ORDER — FUROSEMIDE 10 MG/ML IJ SOLN
40.0000 mg | Freq: Once | INTRAMUSCULAR | Status: AC
  Administered 2017-10-13: 40 mg via INTRAVENOUS
  Filled 2017-10-13: qty 4

## 2017-10-13 MED ORDER — IPRATROPIUM-ALBUTEROL 0.5-2.5 (3) MG/3ML IN SOLN
3.0000 mL | Freq: Three times a day (TID) | RESPIRATORY_TRACT | Status: DC
  Administered 2017-10-13 – 2017-10-14 (×3): 3 mL via RESPIRATORY_TRACT
  Filled 2017-10-13 (×5): qty 3

## 2017-10-13 MED ORDER — POTASSIUM CHLORIDE CRYS ER 20 MEQ PO TBCR
20.0000 meq | EXTENDED_RELEASE_TABLET | Freq: Once | ORAL | Status: AC
  Administered 2017-10-13: 20 meq via ORAL
  Filled 2017-10-13: qty 1

## 2017-10-13 MED ORDER — HYDRALAZINE HCL 25 MG PO TABS
25.0000 mg | ORAL_TABLET | Freq: Three times a day (TID) | ORAL | Status: DC
  Administered 2017-10-13 – 2017-10-15 (×6): 25 mg via ORAL
  Filled 2017-10-13 (×6): qty 1

## 2017-10-13 MED ORDER — ALBUTEROL SULFATE (2.5 MG/3ML) 0.083% IN NEBU: 2.5000 mg | INHALATION_SOLUTION | RESPIRATORY_TRACT | Status: DC | PRN

## 2017-10-13 MED ORDER — GADOBUTROL 1 MMOL/ML IV SOLN
9.0000 mL | Freq: Once | INTRAVENOUS | Status: AC | PRN
  Administered 2017-10-13: 9 mL via INTRAVENOUS

## 2017-10-13 MED ORDER — METOPROLOL TARTRATE 50 MG PO TABS
50.0000 mg | ORAL_TABLET | Freq: Three times a day (TID) | ORAL | Status: DC
  Administered 2017-10-13 – 2017-10-14 (×3): 50 mg via ORAL
  Filled 2017-10-13 (×3): qty 1

## 2017-10-13 NOTE — Progress Notes (Signed)
IS education complete, pt understands technique and reason for use. Pt inspire approx 1431ml+. Pt independent with use.

## 2017-10-13 NOTE — Progress Notes (Signed)
Pharmacy Antibiotic Note  Jesse Mays is a 62 y.o. male admitted on 10/09/2017 with intra-abdominal infection.  Pharmacy has been consulted for meropenem dosing.  Plan: Continue Meropenem 1 gram IV q 8 hours   Height: 5\' 9"  (175.3 cm) Weight: 210 lb (95.3 kg) IBW/kg (Calculated) : 70.7  Temp (24hrs), Avg:98.8 F (37.1 C), Min:98.2 F (36.8 C), Max:99.3 F (37.4 C)  Recent Labs  Lab 10/09/17 1837 10/09/17 2051 10/09/17 2350 10/10/17 0204 10/10/17 0310 10/11/17 0500 10/12/17 0852 10/13/17 0412  WBC 26.0*  --   --   --  16.8* 19.6* 20.1* 14.8*  CREATININE 1.08  --  0.92  --  0.94  --  0.68 0.72  LATICACIDVEN 4.2* 4.8*  --  4.1* 2.3*  --   --   --     Estimated Creatinine Clearance: 109 mL/min (by C-G formula based on SCr of 0.72 mg/dL).    No Known Allergies  Antimicrobials this admission: Zosyn x1; meropenem 10/4  >>    >>   Dose adjustments this admission:   Microbiology results: 10/3 BCx: NGTD 10/3 UCx: NG    Thank you for allowing pharmacy to be a part of this patient's care.  Marty Heck 10/13/2017 12:07 PM

## 2017-10-13 NOTE — Progress Notes (Signed)
Pt refused 0200 hr tx 

## 2017-10-13 NOTE — Progress Notes (Signed)
*  PRELIMINARY RESULTS* Echocardiogram 2D Echocardiogram has been performed.  Jesse Mays 10/13/2017, 11:04 AM

## 2017-10-13 NOTE — Progress Notes (Signed)
[name], MD [address], [place], [place], [address] [address], [address], [place], [place], [address] Phone: [phone]  Fax: [phone]   Subjective: Patient abdominal pain has improved.  No nausea or vomiting.   Objective: Exam: Vital signs in last 24 hours: Vitals:   10/13/17 0329 10/13/17 0547 10/13/17 0716 10/13/17 0734  BP: (!) 173/98   (!) 182/96  Pulse: (!) 103  100 (!) 103  Resp: 20  18   Temp: 98.2 F (36.8 C)     TempSrc: Oral     SpO2: 97% 96% 95% 96%  Weight:      Height:       Weight change:   Intake/Output Summary (Last 24 hours) at 10/13/2017 1259 Last data filed at 10/13/2017 0947 Gross per 24 hour  Intake 181.8 ml  Output 1100 ml  Net -918.2 ml    General: No acute distress, AAO x3 Abd: Soft, NT/ND, No HSM Skin: Warm, no rashes Neck: Supple, Trachea midline   Lab Results: Lab Results  Component Value Date   WBC 14.8 (H) 10/13/2017   HGB 13.4 10/13/2017   HCT 39.6 (L) 10/13/2017   MCV 93.1 10/13/2017   PLT 172 10/13/2017   Micro Results: Recent Results (from the past 240 hour(s))  Blood Culture (routine x 2)     Status: None (Preliminary result)   Collection Time: 10/09/17  7:49 PM  Result Value Ref Range Status   Specimen Description BLOOD RIGHT ANTECUBITAL  Final   Special Requests   Final    BOTTLES DRAWN AEROBIC AND ANAEROBIC Blood Culture results may not be optimal due to an excessive volume of blood received in culture bottles   Culture   Final    NO GROWTH 4 DAYS Performed at [facility], [address] [place], [place] [address]    Report Status PENDING  Incomplete  Blood Culture (routine x 2)     Status: None (Preliminary result)   Collection Time: 10/09/17  7:49 PM  Result Value Ref Range Status   Specimen Description BLOOD BLOOD LEFT FOREARM  Final   Special Requests   Final    BOTTLES DRAWN AEROBIC AND ANAEROBIC Blood Culture results may not be optimal due to an inadequate  volume of blood received in culture bottles   Culture   Final    NO GROWTH 4 DAYS Performed at [facility], [address], [place], [place] [address]    Report Status PENDING  Incomplete  Urine culture     Status: None   Collection Time: 10/09/17  8:51 PM  Result Value Ref Range Status   Specimen Description   Final    URINE, CLEAN CATCH Performed at [facility], [address], [place], [place] [address]    Special Requests   Final    NONE Performed at [facility], [address] [place], [place] [address]    Culture   Final    NO GROWTH Performed at [facility], [address] [place], [place] [address]    Report Status 10/11/2017 FINAL  Final   Studies/Results: Dg Chest 1 View  Result Date: 10/12/2017 CLINICAL DATA:  Shortness of breath EXAM: CHEST  1 VIEW COMPARISON:  None. FINDINGS: Cardiomegaly with vascular congestion. Small bilateral pleural effusions with bilateral lower lobe airspace opacities which could reflect edema or atelectasis. No acute bony abnormality. IMPRESSION: Cardiomegaly with vascular congestion. Bilateral lower lobe opacities could reflect edema or atelectasis. Small  effusions. Electronically Signed   By: Charlett Nose M.D.   On: 10/12/2017 08:44   Medications:  Scheduled Meds: . aspirin EC  81 mg Oral Daily  . enoxaparin (LOVENOX) injection  40 mg Subcutaneous Q24H  . famotidine  20 mg Oral BID  . felodipine  10 mg Oral Daily  . insulin aspart  0-5 Units Subcutaneous QHS  . insulin aspart  0-9 Units Subcutaneous TID WC  . insulin glargine  10 Units Subcutaneous Daily  . ipratropium-albuterol  3 mL Nebulization TID  . lidocaine  1 patch Transdermal Q24H  . lisinopril  40 mg Oral Daily  . metoprolol tartrate  25 mg Oral TID  . ramelteon  8 mg Oral QHS   Continuous Infusions: . meropenem (MERREM) IV 1 g (10/13/17 1113)   PRN Meds:.acetaminophen **OR** acetaminophen, albuterol, hydrALAZINE, HYDROmorphone  (DILAUDID) injection, ondansetron **OR** ondansetron (ZOFRAN) IV, oxyCODONE   Assessment: Principal Problem:   Severe sepsis (HCC) Active Problems:   Diabetes (HCC)   HTN (hypertension)   Pancreatitis    Plan: Patient's abdominal pain is continued to improve Tolerating soft diet His liver enzymes were initially completely normal, and over the last 2 days his total bilirubin is elevated. Direct bilirubin today was elevated to 1.6 Therefore, would recommend an MRCP at this time to rule out any evidence of biliary obstruction IgG4 was collected and is pending  Encourage smoking and alcohol cessation Repeat CMP tomorrow   LOS: 4 days   [name], MD 10/13/2017, 12:59 PM

## 2017-10-13 NOTE — Progress Notes (Addendum)
[facility] - [place] [facility]   PATIENT NAME: Jesse Mays    MR#:  [id number]  DATE OF BIRTH:  1955-04-08  SUBJECTIVE:  patient came in with acute onset abdominal pain and found to have acute pancreatitis with elevated sugars and blood pressure. Patient is tolerating soft diet, total bili is elevated GI is recommending MRCP, patient is agreeable  REVIEW OF SYSTEMS:   Review of Systems  Constitutional: Negative for chills, fever and weight loss.  HENT: Negative for ear discharge, ear pain and nosebleeds.   Eyes: Negative for blurred vision, pain and discharge.  Respiratory: Negative for sputum production, shortness of breath, wheezing and stridor.   Cardiovascular: Negative for chest pain, palpitations, orthopnea and PND.  Gastrointestinal: Positive for abdominal pain. Negative for diarrhea, nausea and vomiting.  Genitourinary: Negative for frequency and urgency.  Musculoskeletal: Negative for back pain and joint pain.  Neurological: Negative for sensory change, speech change, focal weakness and weakness.  Psychiatric/Behavioral: Negative for depression and hallucinations. The patient is not nervous/anxious.    Tolerating Diet:npo Tolerating PT: ambulatory  DRUG ALLERGIES:  No Known Allergies  VITALS:  Blood pressure (!) 182/96, pulse (!) 103, temperature 98.2 F (36.8 C), temperature source Oral, resp. rate 18, height 5\' 9"  (1.753 m), weight 95.3 kg, SpO2 96 %.  PHYSICAL EXAMINATION:   Physical Exam  GENERAL:  62 y.o.-year-old patient lying in the bed with no acute distress.  EYES: Pupils equal, round, reactive to light and accommodation. No scleral icterus. Extraocular muscles intact.  HEENT: Head atraumatic, normocephalic. Oropharynx and nasopharynx clear.  NECK:  Supple, no jugular venous distention. No thyroid enlargement, no tenderness.  LUNGS: Normal breath sounds bilaterally, no wheezing, rales, rhonchi. No use of accessory  muscles of respiration.  CARDIOVASCULAR: S1, S2 normal. No murmurs, rubs, or gallops.  ABDOMEN: Soft, ++ epigastric mid abdomen tender, nondistended. Bowel sounds present. No organomegaly or mass.  EXTREMITIES: No cyanosis, clubbing or edema b/l.    NEUROLOGIC: Cranial nerves II through XII are intact. No focal Motor or sensory deficits b/l.   PSYCHIATRIC:  patient is alert and oriented x 3.  SKIN: No obvious rash, lesion, or ulcer.   LABORATORY PANEL:  CBC Recent Labs  Lab 10/13/17 0412  WBC 14.8*  HGB 13.4  HCT 39.6*  PLT 172    Chemistries  Recent Labs  Lab 10/13/17 0412  NA 137  K 3.4*  CL 101  CO2 27  GLUCOSE 181*  BUN 14  CREATININE 0.72  CALCIUM 8.5*  AST 39  ALT 30  ALKPHOS 65  BILITOT 3.8*   Cardiac Enzymes No results for input(s): TROPONINI in the last 168 hours. RADIOLOGY:  Dg Chest 1 View  Result Date: 10/12/2017 CLINICAL DATA:  Shortness of breath EXAM: CHEST  1 VIEW COMPARISON:  None. FINDINGS: Cardiomegaly with vascular congestion. Small bilateral pleural effusions with bilateral lower lobe airspace opacities which could reflect edema or atelectasis. No acute bony abnormality. IMPRESSION: Cardiomegaly with vascular congestion. Bilateral lower lobe opacities could reflect edema or atelectasis. Small effusions. Electronically Signed   By: Charlett Nose M.D.   On: 10/12/2017 08:44   ASSESSMENT AND PLAN:   Jesse Mays is a 62 y.o. male with a history of HTN, DM, presenting with abdominal pain, nausea vomiting and diarrhea.  The patient reports that at 230 this afternoon, he had acute onset of nausea vomiting and nonbloody diarrhea with associated abdominal pain.  *Pulmonary edema from fluid overload DC IV fluids Lasix  40 mg IV x1 Echocardiogram-60 to 65% ejection fraction, CHF ruled out, no regional wall motion abnormalities Wean off oxygen as tolerated No history of congestive heart failure in the past  * Severe sepsis (HCC) -likely related  to his pancreatitis has no other clear source was elucidated on work-up. -IV Meropenem given initially but cultures are negative will discontinue antibiotics - aggressive IV fluid resuscitation  -blood culture and urine cultures are so far negative -BC 16.8 -LA 4.3--2.3 -WBC 16.8-19.6-14.8  *Acute severe  Pancreatitis -unclear etiology -Clear liquid diet advanced as tolerated-soft diet now -G.I. consulted -seen by Dr. Tobi Bastos  -Check a.m. labs and IgG4 -Total bilirubin is elevated , indirect bilirubin is elevated GI is recommending MRCP to rule out CBD stones, any underlying pancreatic nodules or tumors that could have precipitated the pancreatitis  *  Diabetes (HCC) -significantly elevated.  The patient's anion gap on initial BMP was elevated, but barely so at 16.   He was started on insulin drip by ED physician and clinically improved -Patient is started on Lantus 10 units Q day along with sliding scale.  Clear liquid  *Uncontrolled   HTN (hypertension) -continue home dose DC IV fluids as patient is fluid overloaded  antihypertensives.   -Increase plendil to 10 mg, increase metoprolol dose to 50 mg TID, hydralazine 25 mg is added to the regimen IV hydralazine PRN  *DVT prophylaxis subcu lovenox Case discussed with Care Management/Social Worker. Management plans discussed with the patient, family and they are in agreement.  CODE STATUS: full  DVT Prophylaxis: lovenox  TOTAL TIME TAKING CARE OF THIS PATIENT: *33* minutes.  >50% time spent on counselling and coordination of care  POSSIBLE D/C IN 1-2 DAYS, DEPENDING ON CLINICAL CONDITION.  Note: This dictation was prepared with Dragon dictation along with smaller phrase technology. Any transcriptional errors that result from this process are unintentional.  Ramonita Lab M.D on 10/13/2017 at 1:41 PM  Between 7am to 6pm - Pager - [phone]  After 6pm go to [email or web address] - Social research officer, government  [facility]  Office   [phone]  CC: Primary care physician; Mickey Farber, MDPatient ID: Jesse Mays, male   DOB: 1955/10/31, 62 y.o.   MRN: [id number]

## 2017-10-14 LAB — CULTURE, BLOOD (ROUTINE X 2)
CULTURE: NO GROWTH
CULTURE: NO GROWTH

## 2017-10-14 LAB — COMPREHENSIVE METABOLIC PANEL
ALT: 55 U/L — ABNORMAL HIGH (ref 0–44)
ANION GAP: 10 (ref 5–15)
AST: 54 U/L — ABNORMAL HIGH (ref 15–41)
Albumin: 2.9 g/dL — ABNORMAL LOW (ref 3.5–5.0)
Alkaline Phosphatase: 86 U/L (ref 38–126)
BUN: 17 mg/dL (ref 8–23)
CHLORIDE: 101 mmol/L (ref 98–111)
CO2: 27 mmol/L (ref 22–32)
Calcium: 8.5 mg/dL — ABNORMAL LOW (ref 8.9–10.3)
Creatinine, Ser: 0.64 mg/dL (ref 0.61–1.24)
GFR calc non Af Amer: >60 mL/min (ref >60)
Glucose, Bld: 212 mg/dL — ABNORMAL HIGH (ref 70–99)
Potassium: 3 mmol/L — ABNORMAL LOW (ref 3.5–5.1)
SODIUM: 138 mmol/L (ref 135–145)
Total Bilirubin: 3.7 mg/dL — ABNORMAL HIGH (ref 0.3–1.2)
Total Protein: 6 g/dL — ABNORMAL LOW (ref 6.5–8.1)

## 2017-10-14 LAB — GLUCOSE, CAPILLARY
Glucose-Capillary: 207 mg/dL — ABNORMAL HIGH (ref 70–99)
Glucose-Capillary: 194 mg/dL — ABNORMAL HIGH (ref 70–99)
Glucose-Capillary: 199 mg/dL — ABNORMAL HIGH (ref 70–99)
Glucose-Capillary: 179 mg/dL — ABNORMAL HIGH (ref 70–99)
Glucose-Capillary: 198 mg/dL — ABNORMAL HIGH (ref 70–99)

## 2017-10-14 LAB — BASIC METABOLIC PANEL
ANION GAP: 10 (ref 5–15)
BUN: 17 mg/dL (ref 8–23)
CALCIUM: 8.6 mg/dL — AB (ref 8.9–10.3)
CHLORIDE: 99 mmol/L (ref 98–111)
CO2: 27 mmol/L (ref 22–32)
Creatinine, Ser: 0.69 mg/dL (ref 0.61–1.24)
GFR calc Af Amer: >60 mL/min (ref >60)
GLUCOSE: 218 mg/dL — AB (ref 70–99)
POTASSIUM: 2.9 mmol/L — AB (ref 3.5–5.1)
Sodium: 136 mmol/L (ref 135–145)

## 2017-10-14 LAB — HEPATIC FUNCTION PANEL
ALK PHOS: 73 U/L (ref 38–126)
ALT: 45 U/L — ABNORMAL HIGH (ref 0–44)
AST: 48 U/L — ABNORMAL HIGH (ref 15–41)
Albumin: 2.7 g/dL — ABNORMAL LOW (ref 3.5–5.0)
BILIRUBIN INDIRECT: 1.9 mg/dL — AB (ref 0.3–0.9)
BILIRUBIN TOTAL: 3.6 mg/dL — AB (ref 0.3–1.2)
Bilirubin, Direct: 1.7 mg/dL — ABNORMAL HIGH (ref 0.0–0.2)
Total Protein: 6 g/dL — ABNORMAL LOW (ref 6.5–8.1)

## 2017-10-14 LAB — IGG 4: IgG, Subclass 4: 29 mg/dL (ref 2–96)

## 2017-10-14 MED ORDER — HYDROMORPHONE HCL 1 MG/ML IJ SOLN: 0.5000 mg | INTRAMUSCULAR | Status: DC | PRN

## 2017-10-14 MED ORDER — POTASSIUM CHLORIDE CRYS ER 20 MEQ PO TBCR
40.0000 meq | EXTENDED_RELEASE_TABLET | ORAL | Status: AC
  Administered 2017-10-14 (×2): 40 meq via ORAL
  Filled 2017-10-14 (×2): qty 2

## 2017-10-14 MED ORDER — ZOLPIDEM TARTRATE 5 MG PO TABS
5.0000 mg | ORAL_TABLET | Freq: Every day | ORAL | Status: DC
  Administered 2017-10-14: 5 mg via ORAL
  Filled 2017-10-14: qty 1

## 2017-10-14 MED ORDER — INSULIN GLARGINE 100 UNIT/ML ~~LOC~~ SOLN
15.0000 [IU] | Freq: Every day | SUBCUTANEOUS | Status: DC
  Administered 2017-10-15: 15 [IU] via SUBCUTANEOUS
  Filled 2017-10-14: qty 0.15

## 2017-10-14 MED ORDER — METOPROLOL TARTRATE 50 MG PO TABS
75.0000 mg | ORAL_TABLET | Freq: Three times a day (TID) | ORAL | Status: DC
  Administered 2017-10-14 – 2017-10-15 (×3): 75 mg via ORAL
  Filled 2017-10-14 (×3): qty 2

## 2017-10-14 MED ORDER — PANTOPRAZOLE SODIUM 40 MG PO TBEC
40.0000 mg | DELAYED_RELEASE_TABLET | Freq: Every day | ORAL | Status: DC
  Administered 2017-10-14 – 2017-10-15 (×2): 40 mg via ORAL
  Filled 2017-10-14 (×2): qty 1

## 2017-10-14 NOTE — Progress Notes (Signed)
Inpatient Diabetes Program Recommendations  AACE/ADA: New Consensus Statement on Inpatient Glycemic Control (2015)  Target Ranges:  Prepandial:   less than 140 mg/dL      Peak postprandial:   less than 180 mg/dL (1-2 hours)      Critically ill patients:  140 - 180 mg/dL   Lab Results  Component Value Date   GLUCAP 199 (H) 10/14/2017    Review of Glycemic ControlResults for ALAA, EYERMAN (MRN [id number]) as of 10/14/2017 13:35  Ref. Range 10/13/2017 16:44 10/13/2017 21:52 10/14/2017 07:35 10/14/2017 11:44 10/14/2017 12:51  Glucose-Capillary Latest Ref Range: 70 - 99 mg/dL 811 (H) 914 (H) 782 (H) 194 (H) 199 (H)   Diabetes history: Type 2 DM  Outpatient Diabetes medications: Metformin 1000 mg bid Current orders for Inpatient glycemic control:  Lantus 10 units daily, Novolog sensitive tid with meals and HS Inpatient Diabetes Program Recommendations:   Please consider increasing Lantus to 15 units daily.  Also consider checking A1C to determine potential need for insulin at d/c.   Thanks  Beryl Meager, RN, BC-ADM Inpatient Diabetes Coordinator Pager [phone] (8a-5p)

## 2017-10-14 NOTE — Progress Notes (Signed)
[facility] - [place] at [facility]   PATIENT NAME: Jesse Mays    MR#:  [id number]  DATE OF BIRTH:  1955/07/29  SUBJECTIVE:  patient came in with acute onset abdominal pain and found to have acute pancreatitis with elevated sugars and blood pressure. MRCP with moderate pancreatitis but no necrosis no gallstones Still requiring IV pain medications for abdominal pain control and not feeling good  REVIEW OF SYSTEMS:   Review of Systems  Constitutional: Negative for chills, fever and weight loss.  HENT: Negative for ear discharge, ear pain and nosebleeds.   Eyes: Negative for blurred vision, pain and discharge.  Respiratory: Negative for sputum production, shortness of breath, wheezing and stridor.   Cardiovascular: Negative for chest pain, palpitations, orthopnea and PND.  Gastrointestinal: Positive for abdominal pain. Negative for diarrhea, nausea and vomiting.  Genitourinary: Negative for frequency and urgency.  Musculoskeletal: Negative for back pain and joint pain.  Neurological: Negative for sensory change, speech change, focal weakness and weakness.  Psychiatric/Behavioral: Negative for depression and hallucinations. The patient is not nervous/anxious.    Tolerating Diet:npo Tolerating PT: ambulatory  DRUG ALLERGIES:  No Known Allergies  VITALS:  Blood pressure (!) 173/101, pulse 95, temperature 99.6 F (37.6 C), temperature source Oral, resp. rate 20, height 5\' 9"  (1.753 m), weight 95.3 kg, SpO2 93 %.  PHYSICAL EXAMINATION:   Physical Exam  GENERAL:  62 y.o.-year-old patient lying in the bed with no acute distress.  EYES: Pupils equal, round, reactive to light and accommodation. No scleral icterus. Extraocular muscles intact.  HEENT: Head atraumatic, normocephalic. Oropharynx and nasopharynx clear.  NECK:  Supple, no jugular venous distention. No thyroid enlargement, no tenderness.  LUNGS: Normal breath sounds bilaterally, no wheezing,  rales, rhonchi. No use of accessory muscles of respiration.  CARDIOVASCULAR: S1, S2 normal. No murmurs, rubs, or gallops.  ABDOMEN: Soft, ++ epigastric mid abdomen tender, nondistended. Bowel sounds present. No organomegaly or mass.  EXTREMITIES: No cyanosis, clubbing or edema b/l.    NEUROLOGIC: Cranial nerves II through XII are intact. No focal Motor or sensory deficits b/l.   PSYCHIATRIC:  patient is alert and oriented x 3.  SKIN: No obvious rash, lesion, or ulcer.   LABORATORY PANEL:  CBC Recent Labs  Lab 10/13/17 0412  WBC 14.8*  HGB 13.4  HCT 39.6*  PLT 172    Chemistries  Recent Labs  Lab 10/14/17 1356  NA 138  K 3.0*  CL 101  CO2 27  GLUCOSE 212*  BUN 17  CREATININE 0.64  CALCIUM 8.5*  AST 54*  ALT 55*  ALKPHOS 86  BILITOT 3.7*   Cardiac Enzymes No results for input(s): TROPONINI in the last 168 hours. RADIOLOGY:  Mr 3d Recon At Scanner  Result Date: 10/13/2017 CLINICAL DATA:  Abdominal pain and nausea and vomiting. Fever and sepsis. Acute pancreatitis. EXAM: MRI ABDOMEN WITHOUT AND WITH CONTRAST (INCLUDING MRCP) TECHNIQUE: Multiplanar multisequence MR imaging of the abdomen was performed both before and after the administration of intravenous contrast. Heavily T2-weighted images of the biliary and pancreatic ducts were obtained, and three-dimensional MRCP images were rendered by post processing. CONTRAST:  9 mL Gadavist COMPARISON:  CT on 10/09/2017 FINDINGS: Lower chest: Increased bibasilar atelectasis and tiny bilateral pleural effusions. Hepatobiliary: No hepatic masses identified. Gallbladder contains sludge, however there is no evidence of gallbladder wall thickening or pericholecystic inflammatory changes. No evidence of biliary obstruction or choledocholithiasis. Minimal perihepatic fluid noted. Pancreas: Moderate diffuse pancreatic edema and peripancreatic inflammatory changes are  consistent with acute pancreatitis. No evidence of pancreatic necrosis or  pseudocysts. No evidence of pancreatic ductal dilatation. Spleen:  Within normal limits in size and appearance. Adrenals/Urinary Tract: No masses identified. Small cyst noted in upper pole right kidney. No evidence of hydronephrosis. Stomach/Bowel: Mild wall thickening of duodenum seen, secondary to acute pancreatitis. Vascular/Lymphatic: No pathologically enlarged lymph nodes identified. No abdominal aortic aneurysm. Other:  None. Musculoskeletal:  No suspicious bone lesions identified. IMPRESSION: Moderate acute pancreatitis. No evidence of pancreatic necrosis, pseudocyst, or ductal dilatation. No radiographic evidence of cholecystitis, biliary ductal dilatation, or choledocholithiasis. Mild duodenal wall thickening which is attributable to acute pancreatitis. Increased bibasilar atelectasis and tiny bilateral pleural effusions. Electronically Signed   By: Myles Rosenthal M.D.   On: 10/13/2017 15:30   Mr Abdomen Mrcp Vivien Rossetti Contast  Result Date: 10/13/2017 CLINICAL DATA:  Abdominal pain and nausea and vomiting. Fever and sepsis. Acute pancreatitis. EXAM: MRI ABDOMEN WITHOUT AND WITH CONTRAST (INCLUDING MRCP) TECHNIQUE: Multiplanar multisequence MR imaging of the abdomen was performed both before and after the administration of intravenous contrast. Heavily T2-weighted images of the biliary and pancreatic ducts were obtained, and three-dimensional MRCP images were rendered by post processing. CONTRAST:  9 mL Gadavist COMPARISON:  CT on 10/09/2017 FINDINGS: Lower chest: Increased bibasilar atelectasis and tiny bilateral pleural effusions. Hepatobiliary: No hepatic masses identified. Gallbladder contains sludge, however there is no evidence of gallbladder wall thickening or pericholecystic inflammatory changes. No evidence of biliary obstruction or choledocholithiasis. Minimal perihepatic fluid noted. Pancreas: Moderate diffuse pancreatic edema and peripancreatic inflammatory changes are consistent with acute  pancreatitis. No evidence of pancreatic necrosis or pseudocysts. No evidence of pancreatic ductal dilatation. Spleen:  Within normal limits in size and appearance. Adrenals/Urinary Tract: No masses identified. Small cyst noted in upper pole right kidney. No evidence of hydronephrosis. Stomach/Bowel: Mild wall thickening of duodenum seen, secondary to acute pancreatitis. Vascular/Lymphatic: No pathologically enlarged lymph nodes identified. No abdominal aortic aneurysm. Other:  None. Musculoskeletal:  No suspicious bone lesions identified. IMPRESSION: Moderate acute pancreatitis. No evidence of pancreatic necrosis, pseudocyst, or ductal dilatation. No radiographic evidence of cholecystitis, biliary ductal dilatation, or choledocholithiasis. Mild duodenal wall thickening which is attributable to acute pancreatitis. Increased bibasilar atelectasis and tiny bilateral pleural effusions. Electronically Signed   By: Myles Rosenthal M.D.   On: 10/13/2017 15:30   ASSESSMENT AND PLAN:   SHANTEL WESELY is a 63 y.o. male with a history of HTN, DM, presenting with abdominal pain, nausea vomiting and diarrhea.  The patient reports that at 230 this afternoon, he had acute onset of nausea vomiting and nonbloody diarrhea with associated abdominal pain.  *Acute mod  Pancreatitis -unclear etiology -Clear liquid diet  -G.I. consulted -seen by Dr. Tobi Bastos, Dr. Maximino Greenland  is following this week -Check a.m. labs and IgG4 -Total bilirubin is elevated , indirect bilirubin is elevated  MRCP with moderate pancreatitis, no choledocholithiasis no pancreatic necrosis, no common bile duct dilatation await clinical improvement  *Pulmonary edema from fluid overload DC IV fluids Lasix 40 mg IV x1 Echocardiogram-60 to 65% ejection fraction, CHF ruled out, no regional wall motion abnormalities Wean off oxygen as tolerated No history of congestive heart failure in the past  * Severe sepsis (HCC) -likely related to his pancreatitis  has no other clear source was elucidated on work-up. -IV Meropenem given initially but cultures are negative will discontinue antibiotics - aggressive IV fluid resuscitation  -blood culture and urine cultures are so far negative -BC 16.8 -LA 4.3--2.3 -WBC 16.8-19.6-14.8  *  Hypokalemia replete and recheck   *  Diabetes (HCC) -significantly elevated.  The patient's anion gap on initial BMP was elevated, but barely so at 16.   He was started on insulin drip by ED physician and clinically improved -Patient is started on Lantus 15 units Q day along with sliding scale.  Clear liquid  *Uncontrolled   HTN (hypertension) -continue home dose DC IV fluids as patient is fluid overloaded  antihypertensives.   -Increase plendil to 10 mg, increase metoprolol dose increased from 25-50 with some improvement today I am increasing the to 75 milligrams, titrate as needed  hydralazine 25 mg is added to the regimen IV hydralazine PRN  *DVT prophylaxis subcu lovenox Case discussed with Care Management/Social Worker. Management plans discussed with the patient, family and they are in agreement.  CODE STATUS: full  DVT Prophylaxis: lovenox  TOTAL TIME TAKING CARE OF THIS PATIENT: *33* minutes.  >50% time spent on counselling and coordination of care  POSSIBLE D/C IN 1-2 DAYS, DEPENDING ON CLINICAL CONDITION.  Note: This dictation was prepared with Dragon dictation along with smaller phrase technology. Any transcriptional errors that result from this process are unintentional.  Ramonita Lab M.D on 10/14/2017 at 7:25 PM  Between 7am to 6pm - Pager - [phone]  After 6pm go to [email or web address] - Social research officer, government  [facility]  Office  [phone]  CC: Primary care physician; Mickey Farber, MDPatient ID: Jesse Mays, male   DOB: [birth year], 62 y.o.   MRN: [id number]

## 2017-10-15 LAB — HEMOGLOBIN A1C
Hgb A1c MFr Bld: 6.7 % — ABNORMAL HIGH (ref 4.8–5.6)
Mean Plasma Glucose: 145.59 mg/dL

## 2017-10-15 LAB — COMPREHENSIVE METABOLIC PANEL
ALBUMIN: 2.5 g/dL — AB (ref 3.5–5.0)
ALK PHOS: 92 U/L (ref 38–126)
ALT: 58 U/L — AB (ref 0–44)
AST: 49 U/L — AB (ref 15–41)
Anion gap: 10 (ref 5–15)
BUN: 15 mg/dL (ref 8–23)
CALCIUM: 8.2 mg/dL — AB (ref 8.9–10.3)
CO2: 26 mmol/L (ref 22–32)
CREATININE: 0.75 mg/dL (ref 0.61–1.24)
Chloride: 101 mmol/L (ref 98–111)
GFR calc Af Amer: >60 mL/min (ref >60)
GFR calc non Af Amer: >60 mL/min (ref >60)
GLUCOSE: 203 mg/dL — AB (ref 70–99)
Potassium: 3.3 mmol/L — ABNORMAL LOW (ref 3.5–5.1)
Sodium: 137 mmol/L (ref 135–145)
Total Bilirubin: 3.3 mg/dL — ABNORMAL HIGH (ref 0.3–1.2)
Total Protein: 5.7 g/dL — ABNORMAL LOW (ref 6.5–8.1)

## 2017-10-15 LAB — GLUCOSE, CAPILLARY
Glucose-Capillary: 190 mg/dL — ABNORMAL HIGH (ref 70–99)
Glucose-Capillary: 165 mg/dL — ABNORMAL HIGH (ref 70–99)

## 2017-10-15 MED ORDER — GUAIFENESIN 100 MG/5ML PO SOLN: 5.0000 mL | ORAL | Status: DC | PRN

## 2017-10-15 MED ORDER — BOOST / RESOURCE BREEZE PO LIQD CUSTOM: 1.0000 | Freq: Three times a day (TID) | ORAL | Status: DC

## 2017-10-15 MED ORDER — HYDRALAZINE HCL 25 MG PO TABS: 25.0000 mg | ORAL_TABLET | Freq: Three times a day (TID) | ORAL | 0 refills | Status: AC

## 2017-10-15 MED ORDER — MENTHOL 3 MG MT LOZG: 1.0000 | LOZENGE | OROMUCOSAL | Status: DC | PRN

## 2017-10-15 MED ORDER — OXYCODONE HCL 5 MG PO TABS: 5.0000 mg | ORAL_TABLET | Freq: Four times a day (QID) | ORAL | 0 refills | Status: DC | PRN

## 2017-10-15 MED ORDER — ADULT MULTIVITAMIN W/MINERALS CH: 1.0000 | ORAL_TABLET | Freq: Every day | ORAL | Status: DC

## 2017-10-15 MED ORDER — METOPROLOL TARTRATE 50 MG PO TABS: 50.0000 mg | ORAL_TABLET | Freq: Two times a day (BID) | ORAL | 0 refills | Status: AC

## 2017-10-15 MED ORDER — POTASSIUM CHLORIDE CRYS ER 20 MEQ PO TBCR
40.0000 meq | EXTENDED_RELEASE_TABLET | Freq: Once | ORAL | Status: AC
  Administered 2017-10-15: 40 meq via ORAL
  Filled 2017-10-15: qty 2

## 2017-10-15 NOTE — Care Management (Signed)
Per nursing staff patient has been weaned to RA, and maintained o2 saturations with exertion.

## 2017-10-15 NOTE — Progress Notes (Signed)
Jesse Antigua, MD [address] [place], [place], [place], [address] [address], [place], [place], [place], [address] Phone: [phone]  Fax: [phone]   Subjective: Abdominal pain improved.  Tolerating diet.   Objective: Exam: Vital signs in last 24 hours: Vitals:   10/14/17 1501 10/14/17 1918 10/14/17 2017 10/15/17 0515  BP:  (!) 173/101  (!) 157/86  Pulse: 94 95  91  Resp: 18 20  18   Temp:  99.6 F (37.6 C)  99.1 F (37.3 C)  TempSrc:  Oral  Oral  SpO2: 92% 93% 93% 93%  Weight:      Height:       Weight change:   Intake/Output Summary (Last 24 hours) at 10/15/2017 1155 Last data filed at 10/14/2017 2045 Gross per 24 hour  Intake 60 ml  Output -  Net 60 ml    General: No acute distress, AAO x3 Abd: Soft, NT/ND, No HSM Skin: Warm, no rashes Neck: Supple, Trachea midline   Lab Results: Lab Results  Component Value Date   WBC 14.8 (H) 10/13/2017   HGB 13.4 10/13/2017   HCT 39.6 (L) 10/13/2017   MCV 93.1 10/13/2017   PLT 172 10/13/2017   Micro Results: Recent Results (from the past 240 hour(s))  Blood Culture (routine x 2)     Status: None   Collection Time: 10/09/17  7:49 PM  Result Value Ref Range Status   Specimen Description BLOOD RIGHT ANTECUBITAL  Final   Special Requests   Final    BOTTLES DRAWN AEROBIC AND ANAEROBIC Blood Culture results may not be optimal due to an excessive volume of blood received in culture bottles   Culture   Final    NO GROWTH 5 DAYS Performed at [facility], [address] [place], [place] [address]    Report Status 10/14/2017 FINAL  Final  Blood Culture (routine x 2)     Status: None   Collection Time: 10/09/17  7:49 PM  Result Value Ref Range Status   Specimen Description BLOOD BLOOD LEFT FOREARM  Final   Special Requests   Final    BOTTLES DRAWN AEROBIC AND ANAEROBIC Blood Culture results may not be optimal due to an inadequate volume of blood received in culture bottles   Culture    Final    NO GROWTH 5 DAYS Performed at [facility], [address], [place], [place] [address]    Report Status 10/14/2017 FINAL  Final  Urine culture     Status: None   Collection Time: 10/09/17  8:51 PM  Result Value Ref Range Status   Specimen Description   Final    URINE, CLEAN CATCH Performed at [facility], [address] [place], [place] [address]    Special Requests   Final    NONE Performed at [facility], [address], [place], [place] [address]    Culture   Final    NO GROWTH Performed at [facility], [place] [address] [place], [place] [address]    Report Status 10/11/2017 FINAL  Final   Studies/Results: Mr 3d Recon At Scanner  Result Date: 10/13/2017 CLINICAL DATA:  Abdominal pain and nausea and vomiting. Fever and sepsis. Acute pancreatitis. EXAM: MRI ABDOMEN WITHOUT AND WITH CONTRAST (INCLUDING MRCP) TECHNIQUE: Multiplanar multisequence MR imaging of the abdomen was performed both before and after the administration of intravenous contrast. Heavily T2-weighted images of the biliary and pancreatic ducts were obtained, and three-dimensional MRCP images were rendered  by post processing. CONTRAST:  9 mL Gadavist COMPARISON:  CT on 10/09/2017 FINDINGS: Lower chest: Increased bibasilar atelectasis and tiny bilateral pleural effusions. Hepatobiliary: No hepatic masses identified. Gallbladder contains sludge, however there is no evidence of gallbladder wall thickening or pericholecystic inflammatory changes. No evidence of biliary obstruction or choledocholithiasis. Minimal perihepatic fluid noted. Pancreas: Moderate diffuse pancreatic edema and peripancreatic inflammatory changes are consistent with acute pancreatitis. No evidence of pancreatic necrosis or pseudocysts. No evidence of pancreatic ductal dilatation. Spleen:  Within normal limits in size and appearance. Adrenals/Urinary Tract: No masses identified. Small cyst noted in upper pole  right kidney. No evidence of hydronephrosis. Stomach/Bowel: Mild wall thickening of duodenum seen, secondary to acute pancreatitis. Vascular/Lymphatic: No pathologically enlarged lymph nodes identified. No abdominal aortic aneurysm. Other:  None. Musculoskeletal:  No suspicious bone lesions identified. IMPRESSION: Moderate acute pancreatitis. No evidence of pancreatic necrosis, pseudocyst, or ductal dilatation. No radiographic evidence of cholecystitis, biliary ductal dilatation, or choledocholithiasis. Mild duodenal wall thickening which is attributable to acute pancreatitis. Increased bibasilar atelectasis and tiny bilateral pleural effusions. Electronically Signed   By: Earle Gell M.D.   On: 10/13/2017 15:30   Mr Abdomen Mrcp Moise Boring Contast  Result Date: 10/13/2017 CLINICAL DATA:  Abdominal pain and nausea and vomiting. Fever and sepsis. Acute pancreatitis. EXAM: MRI ABDOMEN WITHOUT AND WITH CONTRAST (INCLUDING MRCP) TECHNIQUE: Multiplanar multisequence MR imaging of the abdomen was performed both before and after the administration of intravenous contrast. Heavily T2-weighted images of the biliary and pancreatic ducts were obtained, and three-dimensional MRCP images were rendered by post processing. CONTRAST:  9 mL Gadavist COMPARISON:  CT on 10/09/2017 FINDINGS: Lower chest: Increased bibasilar atelectasis and tiny bilateral pleural effusions. Hepatobiliary: No hepatic masses identified. Gallbladder contains sludge, however there is no evidence of gallbladder wall thickening or pericholecystic inflammatory changes. No evidence of biliary obstruction or choledocholithiasis. Minimal perihepatic fluid noted. Pancreas: Moderate diffuse pancreatic edema and peripancreatic inflammatory changes are consistent with acute pancreatitis. No evidence of pancreatic necrosis or pseudocysts. No evidence of pancreatic ductal dilatation. Spleen:  Within normal limits in size and appearance. Adrenals/Urinary Tract: No masses  identified. Small cyst noted in upper pole right kidney. No evidence of hydronephrosis. Stomach/Bowel: Mild wall thickening of duodenum seen, secondary to acute pancreatitis. Vascular/Lymphatic: No pathologically enlarged lymph nodes identified. No abdominal aortic aneurysm. Other:  None. Musculoskeletal:  No suspicious bone lesions identified. IMPRESSION: Moderate acute pancreatitis. No evidence of pancreatic necrosis, pseudocyst, or ductal dilatation. No radiographic evidence of cholecystitis, biliary ductal dilatation, or choledocholithiasis. Mild duodenal wall thickening which is attributable to acute pancreatitis. Increased bibasilar atelectasis and tiny bilateral pleural effusions. Electronically Signed   By: Earle Gell M.D.   On: 10/13/2017 15:30   Medications:  Scheduled Meds: . aspirin EC  81 mg Oral Daily  . enoxaparin (LOVENOX) injection  40 mg Subcutaneous Q24H  . famotidine  20 mg Oral BID  . feeding supplement  1 Container Oral TID BM  . felodipine  10 mg Oral Daily  . hydrALAZINE  25 mg Oral Q8H  . insulin aspart  0-5 Units Subcutaneous QHS  . insulin aspart  0-9 Units Subcutaneous TID WC  . insulin glargine  15 Units Subcutaneous Daily  . lidocaine  1 patch Transdermal Q24H  . lisinopril  40 mg Oral Daily  . metoprolol tartrate  75 mg Oral TID  . multivitamin with minerals  1 tablet Oral Daily  . pantoprazole  40 mg Oral Daily  . zolpidem  5 mg  Oral QHS   Continuous Infusions: PRN Meds:.acetaminophen **OR** acetaminophen, albuterol, hydrALAZINE, HYDROmorphone (DILAUDID) injection, ondansetron **OR** ondansetron (ZOFRAN) IV, oxyCODONE   Assessment: Principal Problem:   Severe sepsis ([place]) Active Problems:   Diabetes ([place])   HTN (hypertension)   Pancreatitis    Plan: Patient's alk phos is completely normal Bilirubin is elevated, but indirect bilirubin was higher than direct bilirubin on testing yesterday MRCP did not show any biliary obstruction Therefore,  elevated bilirubin not from biliary obstruction  Continue to advance diet as tolerated Avoid all alcohol Follow-up in GI clinic once discharged Continue daily CMP   LOS: 6 days   Jesse Antigua, MD 10/15/2017, 11:55 AM

## 2017-10-15 NOTE — Progress Notes (Signed)
Advanced care plan.  Purpose of the Encounter: CODE STATUS  Parties in Attendance:Patient  Patient's Decision Capacity:Good  Subjective/Patient's story: Presented for abdominal pain and fever   Objective/Medical story Patient had pancreatitis and sepsis Needed GI evaluation and antibitoics   Goals of care determination:  Advance care directives and goals of care discussed during hospitalization Patient wants everything done which includes cpr, intubation and ventilator if need arises   CODE STATUS: Full code   Time spent discussing advanced care planning: 16 minutes

## 2017-10-15 NOTE — Discharge Summary (Signed)
[facility] [facility] [place] [facility]   PATIENT NAME: Jesse Mays    MR#:  [id number]  DATE OF BIRTH:  1955/01/23  DATE OF ADMISSION:  10/09/2017 ADMITTING PHYSICIAN: Arnaldo Natal, MD  DATE OF DISCHARGE: 10/15/2017 12:56 PM  PRIMARY CARE PHYSICIAN: Mickey Farber, MD   ADMISSION DIAGNOSIS:  Diabetic ketoacidosis without coma associated with other specified diabetes mellitus (HCC) [E13.10] Acute pancreatitis, unspecified complication status, unspecified pancreatitis type [K85.90] Sepsis, due to unspecified organism, unspecified whether acute organ dysfunction present (HCC) [A41.9]  DISCHARGE DIAGNOSIS:  Acute pancreatitis Abnormal liver function tests Sepsis was secondary to pancreatitis Pulmonary edema Hypokalemia Hypertension Diabetes mellitus type 2 WeSECONDARY DIAGNOSIS:   Past Medical History:  Diagnosis Date  . Diabetes mellitus without complication (HCC)   . Hypertension      ADMITTING HISTORY Jesse Mays  is a 62 y.o. male who presents with chief complaint as above.  Patient presents to the ED with sudden onset of upper abdominal pain.  He also developed a fever.  Here in the ED he is found to meet sepsis criteria with elevated white blood cell count, tachycardia.  On work-up he was found to have pancreatitis with possible early necrosis seen on imaging.  Hospitalist were called for admission   HOSPITAL COURSE:  Patient was admitted for sepsis secondary to pancreatitis.  25 he received IV fluids and IV meropenem antibiotic during the stay in the hospital.  S2 neurology evaluation was done for pancreatitis and abnormal liver function tests.  His bilirubin was trended.  His abdominal pain and pancreatitis improved.  Cultures did not reveal any growth.  IV antibiotics were stopped.  IV insulin drip was stopped once blood sugars improved.  Patient tolerated diet well abdominal pain resolved and will be discharged home follow-up with  gastroenterology in the clinic .  Patient was worked up with CT abdomen and MRCP abdomen during the hospitalization.  MRCP abdomen showed pancreatitis but no necrosis.  Potassium has been replaced.  Patient hemodynamically stable will be discharged home.  CONSULTS OBTAINED:  Treatment Team:  Pasty Spillers, MD  DRUG ALLERGIES:  No Known Allergies  DISCHARGE MEDICATIONS:   Allergies as of 10/15/2017   No Known Allergies     Medication List    STOP taking these medications   simvastatin 20 MG tablet Commonly known as:  ZOCOR     TAKE these medications   aspirin EC 81 MG tablet Take 81 mg by mouth daily.   felodipine 5 MG 24 hr tablet Commonly known as:  PLENDIL Take 5 mg by mouth daily.   hydrALAZINE 25 MG tablet Commonly known as:  APRESOLINE Take 1 tablet (25 mg total) by mouth 3 (three) times daily.   lisinopril 40 MG tablet Commonly known as:  PRINIVIL,ZESTRIL Take 40 mg by mouth daily.   metFORMIN 500 MG 24 hr tablet Commonly known as:  GLUCOPHAGE-XR Take 1,000 mg by mouth 2 (two) times daily.   metoprolol tartrate 50 MG tablet Commonly known as:  LOPRESSOR Take 1 tablet (50 mg total) by mouth 2 (two) times daily.   ONE TOUCH ULTRA TEST test strip Generic drug:  glucose blood 1 each by Other route 2 (two) times daily.   oxyCODONE 5 MG immediate release tablet Commonly known as:  Oxy IR/ROXICODONE Take 1 tablet (5 mg total) by mouth every 6 (six) hours as needed for moderate pain or severe pain.   silver sulfADIAZINE 1 % cream Commonly known as:  SILVADENE Apply 1  application topically daily.   vitamin B-12 1000 MCG tablet Commonly known as:  CYANOCOBALAMIN Take 1,000 mcg by mouth daily.       Today  Patient seen and evaluated today No abdominal pain Tolerated diet well No nausea and vomiting VITAL SIGNS:  Blood pressure (!) 157/86, pulse 91, temperature 99.1 F (37.3 C), temperature source Oral, resp. rate 18, height 5\' 9"  (1.753 m),  weight 95.3 kg, SpO2 93 %.  I/O:    Intake/Output Summary (Last 24 hours) at 10/15/2017 1650 Last data filed at 10/14/2017 2045 Gross per 24 hour  Intake 60 ml  Output -  Net 60 ml    PHYSICAL EXAMINATION:  Physical Exam  GENERAL:  62 y.o.-year-old patient lying in the bed with no acute distress.  LUNGS: Normal breath sounds bilaterally, no wheezing, rales,rhonchi or crepitation. No use of accessory muscles of respiration.  CARDIOVASCULAR: S1, S2 normal. No murmurs, rubs, or gallops.  ABDOMEN: Soft, non-tender, non-distended. Bowel sounds present. No organomegaly or mass.  NEUROLOGIC: Moves all 4 extremities. PSYCHIATRIC: The patient is alert and oriented x 3.  SKIN: No obvious rash, lesion, or ulcer.   DATA REVIEW:   CBC Recent Labs  Lab 10/13/17 0412  WBC 14.8*  HGB 13.4  HCT 39.6*  PLT 172    Chemistries  Recent Labs  Lab 10/15/17 0344  NA 137  K 3.3*  CL 101  CO2 26  GLUCOSE 203*  BUN 15  CREATININE 0.75  CALCIUM 8.2*  AST 49*  ALT 58*  ALKPHOS 92  BILITOT 3.3*    Cardiac Enzymes No results for input(s): TROPONINI in the last 168 hours.  Microbiology Results  Results for orders placed or performed during the hospital encounter of 10/09/17  Blood Culture (routine x 2)     Status: None   Collection Time: 10/09/17  7:49 PM  Result Value Ref Range Status   Specimen Description BLOOD RIGHT ANTECUBITAL  Final   Special Requests   Final    BOTTLES DRAWN AEROBIC AND ANAEROBIC Blood Culture results may not be optimal due to an excessive volume of blood received in culture bottles   Culture   Final    NO GROWTH 5 DAYS Performed at [facility], [address] [place], [place] [address]    Report Status 10/14/2017 FINAL  Final  Blood Culture (routine x 2)     Status: None   Collection Time: 10/09/17  7:49 PM  Result Value Ref Range Status   Specimen Description BLOOD BLOOD LEFT FOREARM  Final   Special Requests   Final    BOTTLES DRAWN  AEROBIC AND ANAEROBIC Blood Culture results may not be optimal due to an inadequate volume of blood received in culture bottles   Culture   Final    NO GROWTH 5 DAYS Performed at [facility], [address], [place], [place] [address]    Report Status 10/14/2017 FINAL  Final  Urine culture     Status: None   Collection Time: 10/09/17  8:51 PM  Result Value Ref Range Status   Specimen Description   Final    URINE, CLEAN CATCH Performed at [facility], [address], [place], [place] [address]    Special Requests   Final    NONE Performed at [facility], [address] [place], [place] [address]    Culture   Final    NO GROWTH Performed at [facility], [address], [place], [place]  [address]    Report Status 10/11/2017 FINAL  Final    RADIOLOGY:  No results found.  Follow up with PCP in 1 week.  Management plans discussed with the patient, family and they are in agreement.  CODE STATUS: Full code Code Status History    Date Active Date Inactive Code Status Order ID Comments User Context   10/10/2017 0228 10/15/2017 1601 Full Code [id number]  Oralia Manis, MD ED      TOTAL TIME TAKING CARE OF THIS PATIENT ON DAY OF DISCHARGE: more than 33 minutes.   Ihor Austin M.D on 10/15/2017 at 4:50 PM  Between 7am to 6pm - Pager - [phone]  After 6pm go to [email or web address] - password [email or web address] [facility]  [facility] [place] [facility]  Office  [phone]  CC: Primary care physician; Mickey Farber, MD  Note: This dictation was prepared with Dragon dictation along with smaller phrase technology. Any transcriptional errors that result from this process are unintentional.

## 2017-10-15 NOTE — Progress Notes (Signed)
Pt discharged per MD order. IV removed. Discharge instructions reviewed with pt. Pt verbalized understanding of instructions with all questions answered to pt satisfaction. Prescriptions given to pt. Pt taken to car in wheelchair by staff.

## 2017-10-21 ENCOUNTER — Telehealth: Payer: Self-pay

## 2017-10-21 NOTE — Telephone Encounter (Signed)
Flagged on EMMI report for not receiving discharge papers and having other questions or problems. Called and spoke with patient who mentioned he did receive his discharge papers and that staff went over it with him, along with his medications. He expressed that he was concerned that no one went over what he should follow for his diet with his pancreatitis diagnosis.  I apologized that staff did not address his questions while at the hospital.  Per AVS he has follow up appointments with [facility] and [facility] tomorrow.  Encouraged him to talk to the providers during his appointments for guidance on his diet.  No other questions or concerns currently.  I thanked him for his time.

## 2017-10-22 ENCOUNTER — Encounter: Payer: Self-pay | Admitting: Gastroenterology

## 2017-10-22 ENCOUNTER — Ambulatory Visit: Payer: BC Managed Care – PPO | Admitting: Gastroenterology

## 2017-10-22 ENCOUNTER — Other Ambulatory Visit: Payer: Self-pay

## 2017-10-22 ENCOUNTER — Ambulatory Visit
Admission: RE | Admit: 2017-10-22 | Discharge: 2017-10-22 | Disposition: A | Discharge status: Home / Self Care | Payer: BC Managed Care – PPO | Source: Ambulatory Visit | Attending: Gastroenterology | Admitting: Gastroenterology

## 2017-10-22 ENCOUNTER — Other Ambulatory Visit: Payer: Self-pay | Admitting: Gastroenterology

## 2017-10-22 VITALS — BP 160/82 | HR 89 | Ht 69.0 in | Wt 193.4 lb

## 2017-10-22 DIAGNOSIS — K859 Acute pancreatitis without necrosis or infection, unspecified: Secondary | ICD-10-CM

## 2017-10-22 DIAGNOSIS — R748 Abnormal levels of other serum enzymes: Secondary | ICD-10-CM | POA: Diagnosis not present

## 2017-10-22 DIAGNOSIS — K852 Alcohol induced acute pancreatitis without necrosis or infection: Secondary | ICD-10-CM

## 2017-10-22 DIAGNOSIS — R945 Abnormal results of liver function studies: Secondary | ICD-10-CM

## 2017-10-22 MED ORDER — GADOBUTROL 1 MMOL/ML IV SOLN
9.0000 mL | Freq: Once | INTRAVENOUS | Status: AC | PRN
  Administered 2017-10-22: 9 mL via INTRAVENOUS

## 2017-10-22 NOTE — Progress Notes (Signed)
Jesse Antigua, MD [address]  [place]  [place], [place] [address]  Main: [phone]  Fax: [phone]   Primary Care Physician: Ezequiel Kayser, MD  Primary Gastroenterologist:  Dr. Vonda Mays  Chief Complaint  Patient presents with  . New Patient (Initial Visit)    abdominal pain-acute pancreatitis, sepsis, diabetic ketoacidosis    HPI: Jesse Mays is a 62 y.o. 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  Was discharged on October 9 and has not had any alcohol since then.  Is eating solid meals, but reports fatigue since being home.  No nausea or vomiting.  No diarrhea, melena or hematochezia.  No fever or chills.  Patient was admitted on October 09, 2017 with pancreatitis.  CT showed large amount of peripancreatic inflammation and fluid with significant fatty replacement of the pancreas and areas of hypoenhancement at the pancreatic head neck, possible early necrosis changes.  No organized fluid collections.  Lipase is elevated to 748 on admission.  Patient did well with conservative management with IV fluids, pain control and was discharged once he was tolerating solid food diet.  IgG4, triglyceride level was normal.  Patient total bilirubin was noted to be elevated after 10/12/2017 ranging from 3.3-4.3.  Indirect bilirubin was higher at 1.9 by direct bilirubin at 1.7.  MRCP was done due to elevated bilirubin to rule out choledocholithiasis and showed moderate acute pancreatitis, no evidence of pancreatic necrosis, pseudocyst or ductal dilation.  No choledocholithiasis noted on MRCP  Patient visited his primary care provider today who repeated labs and total bilirubin is noted to be elevated to 5.3, alk phos elevated to 373, AST 111, ALT 228.  Current Outpatient Medications  Medication Sig Dispense Refill  . aspirin EC 81 MG tablet Take 81 mg by mouth daily.    . felodipine (PLENDIL) 5 MG 24 hr tablet Take 5 mg by mouth daily.      Marland Kitchen glucose blood (ONE TOUCH ULTRA TEST) test strip 1 each by Other route 2 (two) times daily.     . hydrALAZINE (APRESOLINE) 25 MG tablet Take 1 tablet (25 mg total) by mouth 3 (three) times daily. 90 tablet 0  . lisinopril (PRINIVIL,ZESTRIL) 40 MG tablet Take 40 mg by mouth daily.     . metFORMIN (GLUCOPHAGE-XR) 500 MG 24 hr tablet Take 1,000 mg by mouth 2 (two) times daily.     . metoprolol tartrate (LOPRESSOR) 50 MG tablet Take 1 tablet (50 mg total) by mouth 2 (two) times daily. 60 tablet 0  . Multiple Vitamins-Minerals (PX COMPLETE SENIOR MULTIVITS) TABS Take by mouth.    . ondansetron (ZOFRAN) 4 MG tablet Take by mouth.    . oxyCODONE (OXY IR/ROXICODONE) 5 MG immediate release tablet Take 1 tablet (5 mg total) by mouth every 6 (six) hours as needed for moderate pain or severe pain. 20 tablet 0  . silver sulfADIAZINE (SILVADENE) 1 % cream Apply 1 application topically daily. 50 g 0  . vitamin B-12 (CYANOCOBALAMIN) 1000 MCG tablet Take 1,000 mcg by mouth daily.      No current facility-administered medications for this visit.     Allergies as of 10/22/2017  . (No Known Allergies)    ROS:  General: Negative for anorexia, weight loss, fever, chills, fatigue, weakness. ENT: Negative for hoarseness, difficulty swallowing , nasal congestion. CV: Negative for chest pain, angina, palpitations, dyspnea on exertion, peripheral edema.  Respiratory: Negative for dyspnea at rest, dyspnea on exertion, cough, sputum, wheezing.  GI: See history of present illness. GU:  Negative for dysuria, hematuria, urinary incontinence, urinary frequency, nocturnal urination.  Endo: Negative for unusual weight change.    Physical Examination:   BP (!) 160/82   Pulse 89   Ht _0  (1.753 m)   Wt 193 lb 6.4 oz (87.7 kg)   BMI 28.56 kg/m   General: Well-nourished, well-developed in no acute distress.  Eyes: No icterus. Conjunctivae pink. Mouth: Oropharyngeal mucosa moist and pink , no lesions erythema or  exudate. Neck: Supple, Trachea midline Abdomen: Bowel sounds are normal, nontender, nondistended, no hepatosplenomegaly or masses, no abdominal bruits or hernia , no rebound or guarding.   Extremities: No lower extremity edema. No clubbing or deformities. Neuro: Alert and oriented x 3.  Grossly intact. Skin: Warm and dry, no jaundice.   Psych: Alert and cooperative, normal mood and affect.   Labs: CMP     Component Value Date/Time   NA 137 10/15/2017 0344   K 3.3 (L) 10/15/2017 0344   CL 101 10/15/2017 0344   CO2 26 10/15/2017 0344   GLUCOSE 203 (H) 10/15/2017 0344   BUN 15 10/15/2017 0344   CREATININE 0.75 10/15/2017 0344   CALCIUM 8.2 (L) 10/15/2017 0344   PROT 5.7 (L) 10/15/2017 0344   ALBUMIN 2.5 (L) 10/15/2017 0344   AST 49 (H) 10/15/2017 0344   ALT 58 (H) 10/15/2017 0344   ALKPHOS 92 10/15/2017 0344   BILITOT 3.3 (H) 10/15/2017 0344   GFRNONAA >60 10/15/2017 0344   GFRAA >60 10/15/2017 0344   Lab Results  Component Value Date   WBC 14.8 (H) 10/13/2017   HGB 13.4 10/13/2017   HCT 39.6 (L) 10/13/2017   MCV 93.1 10/13/2017   PLT 172 10/13/2017    Imaging Studies: Dg Chest 1 View  Result Date: 10/12/2017 CLINICAL DATA:  Shortness of breath EXAM: CHEST  1 VIEW COMPARISON:  None. FINDINGS: Cardiomegaly with vascular congestion. Small bilateral pleural effusions with bilateral lower lobe airspace opacities which could reflect edema or atelectasis. No acute bony abnormality. IMPRESSION: Cardiomegaly with vascular congestion. Bilateral lower lobe opacities could reflect edema or atelectasis. Small effusions. Electronically Signed   By: Rolm Baptise M.D.   On: 10/12/2017 08:44   Ct Abdomen Pelvis W Contrast  Result Date: 10/09/2017 CLINICAL DATA:  Generalized abdominal pain with nausea and vomiting EXAM: CT ABDOMEN AND PELVIS WITH CONTRAST TECHNIQUE: Multidetector CT imaging of the abdomen and pelvis was performed using the standard protocol following bolus administration of  intravenous contrast. CONTRAST:  177m ISOVUE-300 IOPAMIDOL (ISOVUE-300) INJECTION 61% COMPARISON:  None. FINDINGS: Lower chest: Lung bases demonstrate no acute consolidation or effusion. The heart size is within normal limits. Hepatobiliary: No focal liver abnormality is seen. No gallstones, gallbladder wall thickening, or biliary dilatation. Pancreas: Large amount of peripancreatic inflammation and fluid consistent with acute pancreatitis. There is significant fatty replacement of the pancreas. There are areas of hypoenhancement at the pancreatic head and neck. No organized fluid collection. Spleen: Normal in size without focal abnormality. Adrenals/Urinary Tract: Adrenal glands are normal. Cyst in the upper pole right kidney. No hydronephrosis. Bladder unremarkable Stomach/Bowel: Stomach is within normal limits. Appendix is not well seen but no right lower quadrant inflammation. Sigmoid colon diverticular disease without acute inflammatory process. No evidence of bowel wall thickening, distention, or inflammatory changes. Vascular/Lymphatic: Nonaneurysmal aorta. Moderate aortic atherosclerosis. No significantly enlarged lymph nodes. Normal portal vein and splenic vein enhancement. Reproductive: Prostate is unremarkable. Other: No free air. Moderate fluid within the right  greater than left paracolic gutters. Musculoskeletal: No acute or significant osseous findings. IMPRESSION: 1. Moderate severe inflammatory change and fluid centered around the pancreas, consistent with acute pancreatitis. No organized fluid collections at this time. Mild hypoenhancement of the head and neck of the pancreas, possible early necrosis changes. 2. Sigmoid colon diverticular disease without acute inflammatory change. Electronically Signed   By: Donavan Foil M.D.   On: 10/09/2017 21:40   Mr 3d Recon At Scanner  Result Date: 10/13/2017 CLINICAL DATA:  Abdominal pain and nausea and vomiting. Fever and sepsis. Acute pancreatitis.  EXAM: MRI ABDOMEN WITHOUT AND WITH CONTRAST (INCLUDING MRCP) TECHNIQUE: Multiplanar multisequence MR imaging of the abdomen was performed both before and after the administration of intravenous contrast. Heavily T2-weighted images of the biliary and pancreatic ducts were obtained, and three-dimensional MRCP images were rendered by post processing. CONTRAST:  9 mL Gadavist COMPARISON:  CT on 10/09/2017 FINDINGS: Lower chest: Increased bibasilar atelectasis and tiny bilateral pleural effusions. Hepatobiliary: No hepatic masses identified. Gallbladder contains sludge, however there is no evidence of gallbladder wall thickening or pericholecystic inflammatory changes. No evidence of biliary obstruction or choledocholithiasis. Minimal perihepatic fluid noted. Pancreas: Moderate diffuse pancreatic edema and peripancreatic inflammatory changes are consistent with acute pancreatitis. No evidence of pancreatic necrosis or pseudocysts. No evidence of pancreatic ductal dilatation. Spleen:  Within normal limits in size and appearance. Adrenals/Urinary Tract: No masses identified. Small cyst noted in upper pole right kidney. No evidence of hydronephrosis. Stomach/Bowel: Mild wall thickening of duodenum seen, secondary to acute pancreatitis. Vascular/Lymphatic: No pathologically enlarged lymph nodes identified. No abdominal aortic aneurysm. Other:  None. Musculoskeletal:  No suspicious bone lesions identified. IMPRESSION: Moderate acute pancreatitis. No evidence of pancreatic necrosis, pseudocyst, or ductal dilatation. No radiographic evidence of cholecystitis, biliary ductal dilatation, or choledocholithiasis. Mild duodenal wall thickening which is attributable to acute pancreatitis. Increased bibasilar atelectasis and tiny bilateral pleural effusions. Electronically Signed   By: Earle Gell M.D.   On: 10/13/2017 15:30   Mr Abdomen Mrcp Moise Boring Contast  Result Date: 10/13/2017 CLINICAL DATA:  Abdominal pain and nausea and  vomiting. Fever and sepsis. Acute pancreatitis. EXAM: MRI ABDOMEN WITHOUT AND WITH CONTRAST (INCLUDING MRCP) TECHNIQUE: Multiplanar multisequence MR imaging of the abdomen was performed both before and after the administration of intravenous contrast. Heavily T2-weighted images of the biliary and pancreatic ducts were obtained, and three-dimensional MRCP images were rendered by post processing. CONTRAST:  9 mL Gadavist COMPARISON:  CT on 10/09/2017 FINDINGS: Lower chest: Increased bibasilar atelectasis and tiny bilateral pleural effusions. Hepatobiliary: No hepatic masses identified. Gallbladder contains sludge, however there is no evidence of gallbladder wall thickening or pericholecystic inflammatory changes. No evidence of biliary obstruction or choledocholithiasis. Minimal perihepatic fluid noted. Pancreas: Moderate diffuse pancreatic edema and peripancreatic inflammatory changes are consistent with acute pancreatitis. No evidence of pancreatic necrosis or pseudocysts. No evidence of pancreatic ductal dilatation. Spleen:  Within normal limits in size and appearance. Adrenals/Urinary Tract: No masses identified. Small cyst noted in upper pole right kidney. No evidence of hydronephrosis. Stomach/Bowel: Mild wall thickening of duodenum seen, secondary to acute pancreatitis. Vascular/Lymphatic: No pathologically enlarged lymph nodes identified. No abdominal aortic aneurysm. Other:  None. Musculoskeletal:  No suspicious bone lesions identified. IMPRESSION: Moderate acute pancreatitis. No evidence of pancreatic necrosis, pseudocyst, or ductal dilatation. No radiographic evidence of cholecystitis, biliary ductal dilatation, or choledocholithiasis. Mild duodenal wall thickening which is attributable to acute pancreatitis. Increased bibasilar atelectasis and tiny bilateral pleural effusions. Electronically Signed   By: Jenny Reichmann  Kris Hartmann M.D.   On: 10/13/2017 15:30    Assessment and Plan:   WILMAR PRABHAKAR is a 62  y.o. y/o male with recent admission for alcoholic pancreatitis, with pancreatic necrosis noted on initial CT and admission, with MRCP on the same admission not showing any pancreatic necrosis or pseudocyst, now with increasing liver enzymes  Patient denies any worsening abdominal pain, fever chills, or any other alarm symptoms His elevated liver enzymes require further evaluation Will order MRCP to evaluate his bile ducts, and reevaluate his pancreas as well IgG4 and triglyceride levels were normal during hospital admission We will order acute hepatitis panel, and autoimmune liver testing for further evaluation as well  Patient and wife were informed that they should come to the ER or inform us immediately if he develops worsening abdominal pain, fever or chills, loss of appetite, or any other reason for concern and they verbalized understanding.  Patient encouraged to continue to avoid alcohol or smoking   Dr Jesse Mays

## 2017-10-23 ENCOUNTER — Telehealth: Payer: Self-pay

## 2017-10-23 ENCOUNTER — Telehealth: Payer: Self-pay | Admitting: Gastroenterology

## 2017-10-23 LAB — BILIRUBIN, DIRECT: Bilirubin, Direct: 4.86 mg/dL — ABNORMAL HIGH (ref 0.00–0.40)

## 2017-10-23 LAB — CERULOPLASMIN: Ceruloplasmin: 33.3 mg/dL — ABNORMAL HIGH (ref 16.0–31.0)

## 2017-10-23 NOTE — Telephone Encounter (Signed)
Pt left vm to get MRI and Lab results

## 2017-10-23 NOTE — Telephone Encounter (Signed)
Pt informed to go to [facility] ER per Dr. Maximino Greenland for common bile duct obstruction. The doctor for the referral has not responded yet and does not want for it to get too late in the day. I will contact [facility] for the ED fax number and send pt's MRCP and lab results and also office note.  FAX number for [facility] ER: [phone].  Information faxed and confirmation received.

## 2017-10-23 NOTE — Telephone Encounter (Signed)
Pt  Left vm again about results

## 2017-10-23 NOTE — Telephone Encounter (Signed)
Patient calling for stat MRI and labs.

## 2017-10-23 NOTE — Telephone Encounter (Signed)
Dr. Maximino Greenland informed pt of results of labs and MRCP.

## 2017-10-23 NOTE — Telephone Encounter (Signed)
Dr. Tahiliani spoke with pt. 

## 2017-10-24 ENCOUNTER — Telehealth: Payer: Self-pay

## 2017-10-24 LAB — HEPATITIS B CORE ANTIBODY, IGM: HEP B C IGM: NEGATIVE

## 2017-10-24 LAB — HEPATITIS A ANTIBODY, TOTAL: Hep A Total Ab: NEGATIVE

## 2017-10-24 LAB — HEPATITIS B CORE ANTIBODY, TOTAL: HEP B C TOTAL AB: NEGATIVE

## 2017-10-24 LAB — FERRITIN: Ferritin: 2006 ng/mL — ABNORMAL HIGH (ref 30–400)

## 2017-10-24 LAB — MITOCHONDRIAL/SMOOTH MUSCLE AB PNL
Mitochondrial Ab: <20 Units (ref 0.0–20.0)
Smooth Muscle Ab: 6 Units (ref 0–19)

## 2017-10-24 LAB — HCV RNA QUANT: Hepatitis C Quantitation: NOT DETECTED IU/mL

## 2017-10-24 LAB — HEPATITIS B SURFACE ANTIGEN: HEP B S AG: NEGATIVE

## 2017-10-24 LAB — HEPATITIS A ANTIBODY, IGM: Hep A IgM: NEGATIVE

## 2017-10-24 LAB — HEPATITIS B SURFACE ANTIBODY,QUALITATIVE: Hep B Surface Ab, Qual: REACTIVE

## 2017-10-24 LAB — HCV COMMENT:

## 2017-10-24 LAB — HEPATITIS C ANTIBODY (REFLEX)

## 2017-10-24 MED ORDER — VITAMIN B-12 1000 MCG PO TABS
1000.00 | ORAL_TABLET | ORAL | Status: DC
Start: 2017-10-25 — End: 2017-10-24

## 2017-10-24 MED ORDER — DEXTROSE 10 % IV SOLN
25.00 | INTRAVENOUS | Status: DC
Start: ? — End: 2017-10-24

## 2017-10-24 MED ORDER — ACETAMINOPHEN 325 MG PO TABS
650.00 | ORAL_TABLET | ORAL | Status: DC
Start: ? — End: 2017-10-24

## 2017-10-24 MED ORDER — MELATONIN 3 MG PO TABS
3.00 | ORAL_TABLET | ORAL | Status: DC
Start: ? — End: 2017-10-24

## 2017-10-24 MED ORDER — INSULIN LISPRO 100 UNIT/ML ~~LOC~~ SOLN
0.00 | SUBCUTANEOUS | Status: DC
Start: 2017-10-24 — End: 2017-10-24

## 2017-10-24 MED ORDER — GENERIC EXTERNAL MEDICATION
2.00 | Status: DC
Start: ? — End: 2017-10-24

## 2017-10-24 MED ORDER — ONDANSETRON 4 MG PO TBDP
4.00 | ORAL_TABLET | ORAL | Status: DC
Start: ? — End: 2017-10-24

## 2017-10-24 MED ORDER — HYDRALAZINE HCL 25 MG PO TABS
25.00 | ORAL_TABLET | ORAL | Status: DC
Start: 2017-10-24 — End: 2017-10-24

## 2017-10-24 MED ORDER — AMLODIPINE BESYLATE 10 MG PO TABS
10.00 | ORAL_TABLET | ORAL | Status: DC
Start: 2017-10-25 — End: 2017-10-24

## 2017-10-24 MED ORDER — METOPROLOL TARTRATE 50 MG PO TABS
50.00 | ORAL_TABLET | ORAL | Status: DC
Start: 2017-10-24 — End: 2017-10-24

## 2017-10-24 MED ORDER — POLYETHYLENE GLYCOL 3350 17 G PO PACK
17.00 | PACK | ORAL | Status: DC
Start: 2017-10-25 — End: 2017-10-24

## 2017-10-24 MED ORDER — INFLUENZA VAC SPLIT QUAD 0.5 ML IM SUSY
0.50 | PREFILLED_SYRINGE | INTRAMUSCULAR | Status: DC
Start: ? — End: 2017-10-24

## 2017-10-24 NOTE — Telephone Encounter (Signed)
Dr. Maximino Greenland spoke with pt yesterday and sent him to ED at [facility].

## 2017-10-24 NOTE — Telephone Encounter (Signed)
-----   Message from Pasty Spillers, MD sent at 10/23/2017  1:56 PM EDT ----- Eunice Blase, please let patient know his lab work showed no evidence of viral hepatitis.  His ferritin is elevated, which can be elevated in the setting of inflammation from his pancreas.  We can repeat this in the future once his pancreatitis improves.  As I said in my MRCP result note, we will work on referral to [facility] or [facility], or [facility].  If he develops worsening abdominal pain, fever or chills, nausea or vomiting, or any other reason for concern, please ask him to go to the ER.  Otherwise, we will proceed with outpatient referrals.

## 2017-11-16 ENCOUNTER — Other Ambulatory Visit: Payer: Self-pay

## 2017-11-16 ENCOUNTER — Encounter: Payer: Self-pay | Admitting: Gynecology

## 2017-11-16 ENCOUNTER — Ambulatory Visit
Admission: EM | Admit: 2017-11-16 | Discharge: 2017-11-16 | Disposition: A | Discharge status: Home / Self Care | Payer: BC Managed Care – PPO | Attending: Family Medicine | Admitting: Family Medicine

## 2017-11-16 DIAGNOSIS — Z8719 Personal history of other diseases of the digestive system: Secondary | ICD-10-CM | POA: Diagnosis not present

## 2017-11-16 DIAGNOSIS — R031 Nonspecific low blood-pressure reading: Secondary | ICD-10-CM | POA: Diagnosis not present

## 2017-11-16 LAB — CBC WITH DIFFERENTIAL/PLATELET
Abs Immature Granulocytes: 0.03 10*3/uL (ref 0.00–0.07)
BASOS PCT: 1 %
Basophils Absolute: 0.1 10*3/uL (ref 0.0–0.1)
EOS ABS: 0.2 10*3/uL (ref 0.0–0.5)
EOS PCT: 2 %
HEMATOCRIT: 39.4 % (ref 39.0–52.0)
Hemoglobin: 12.9 g/dL — ABNORMAL LOW (ref 13.0–17.0)
Immature Granulocytes: 0 %
LYMPHS ABS: 1.6 10*3/uL (ref 0.7–4.0)
Lymphocytes Relative: 22 %
MCH: 30.9 pg (ref 26.0–34.0)
MCHC: 32.7 g/dL (ref 30.0–36.0)
MCV: 94.5 fL (ref 80.0–100.0)
MONO ABS: 0.5 10*3/uL (ref 0.1–1.0)
MONOS PCT: 7 %
NEUTROS PCT: 68 %
Neutro Abs: 5 10*3/uL (ref 1.7–7.7)
PLATELETS: 295 10*3/uL (ref 150–400)
RBC: 4.17 MIL/uL — ABNORMAL LOW (ref 4.22–5.81)
RDW: 13.7 % (ref 11.5–15.5)
WBC: 7.3 10*3/uL (ref 4.0–10.5)
nRBC: 0 % (ref 0.0–0.2)

## 2017-11-16 NOTE — ED Triage Notes (Signed)
Per patient had diagnosis x 1 month ago of pancreatitis. Patient stated blood pressure running low and would like to have his WBC check . Patient stated also taking 3 different types of blood pressure medications.

## 2017-11-16 NOTE — ED Provider Notes (Signed)
[facility]    CSN: [id number] Arrival date & time: 11/16/17  1250     History   Chief Complaint Chief Complaint  Patient presents with  . Check blood work    HPI Jesse Mays is a 62 y.o. male.   62 yo male with a c/o "blood pressure is lower than normal for me and this was a presenting sign when they discovered my pancreas infection, along with elevated white blood cell count.". Patient has a recent h/o pancreatitis and pancreatic necrosis. Patient requesting to have a CBC checked. Denies any pain.   The history is provided by the patient.    Past Medical History:  Diagnosis Date  . Diabetes mellitus without complication (HCC)   . Hypertension     Patient Active Problem List   Diagnosis Date Noted  . Severe sepsis (HCC) 10/09/2017  . Type 2 diabetes mellitus with other specified complication (HCC) 10/09/2017  . Hypertension associated with type 2 diabetes mellitus (HCC) 10/09/2017  . Pancreatitis 10/09/2017  . 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 Erectile dysfunction associated with type 2 diabetes mellitus (HCC) 08/07/2016  . B12 deficiency 02/08/2016  . High risk medication use 02/02/2015  . Adult BMI > 30 01/30/2015  . Hyperlipidemia 07/27/2013    Past Surgical History:  Procedure Laterality Date  . COLONOSCOPY         Home Medications    Prior to Admission medications   Medication Sig Start Date End Date Taking? Authorizing Provider  aspirin EC 81 MG tablet Take 81 mg by mouth daily.   Yes [provider]  felodipine (PLENDIL) 5 MG 24 hr tablet Take 5 mg by mouth daily.  05/13/17  Yes [provider]  glucose blood (ONE TOUCH ULTRA TEST) test strip 1 each by Other route 2 (two) times daily.  05/19/17  Yes [provider]  hydrALAZINE (APRESOLINE) 25 MG tablet Take 1 tablet (25 mg total) by mouth 3 (three) times daily. 10/15/17 11/16/17 Yes Pyreddy, Vivien Rota, MD  lisinopril (PRINIVIL,ZESTRIL) 40 MG tablet Take 40 mg  by mouth daily.  01/31/17 01/31/18 Yes [provider]  metFORMIN (GLUCOPHAGE-XR) 500 MG 24 hr tablet Take 1,000 mg by mouth 2 (two) times daily.  09/01/17  Yes [provider]  metoprolol tartrate (LOPRESSOR) 50 MG tablet Take 1 tablet (50 mg total) by mouth 2 (two) times daily. 10/15/17 11/16/17 Yes Pyreddy, Vivien Rota, MD  Multiple Vitamins-Minerals (PX COMPLETE SENIOR MULTIVITS) TABS Take by mouth.   Yes [provider]  vitamin B-12 (CYANOCOBALAMIN) 1000 MCG tablet Take 1,000 mcg by mouth daily.    Yes [provider]  LEVEMIR FLEXTOUCH 100 UNIT/ML Pen INJECT 6 UNITS SUBCUTANEOUSLY NIGHTLY INCREASE BY 2 UNITS AS DIRECTED 11/04/17   [provider]  ondansetron (ZOFRAN) 4 MG tablet Take by mouth. 10/22/17   [provider]  oxyCODONE (OXY IR/ROXICODONE) 5 MG immediate release tablet Take 1 tablet (5 mg total) by mouth every 6 (six) hours as needed for moderate pain or severe pain. 10/15/17   Ihor Austin, MD  silver sulfADIAZINE (SILVADENE) 1 % cream Apply 1 application topically daily. 09/05/17   Renford Dills, NP    Family History Family History  Problem Relation Age of Onset  . Diabetes Mother   . Diabetes Father     Social History Social History   Tobacco Use  . Smoking status: Never Smoker  . Smokeless tobacco: Never Used  Substance Use Topics  . Alcohol use: Yes  Comment: socially  . Drug use: Never     Allergies   Patient has no known allergies.   Review of Systems Review of Systems   Physical Exam Triage Vital Signs ED Triage Vitals  Enc Vitals Group     BP 11/16/17 1327 116/75     Pulse Rate 11/16/17 1327 60     Resp 11/16/17 1327 16     Temp 11/16/17 1327 98.7 F (37.1 C)     Temp Source 11/16/17 1327 Oral     SpO2 11/16/17 1327 99 %     Weight 11/16/17 1329 183 lb (83 kg)     Height --      Head Circumference --      Peak Flow --      Pain Score 11/16/17 1329 0     Pain Loc --      Pain Edu? --       Excl. in GC? --    No data found.  Updated Vital Signs BP 116/75 (BP Location: Left Arm)   Pulse 60   Temp 98.7 F (37.1 C) (Oral)   Resp 16   Wt 83 kg   SpO2 99%   BMI 27.02 kg/m   Visual Acuity Right Eye Distance:   Left Eye Distance:   Bilateral Distance:    Right Eye Near:   Left Eye Near:    Bilateral Near:     Physical Exam  Constitutional: He appears well-developed and well-nourished. No distress.  Abdominal: Soft. Bowel sounds are normal. He exhibits no distension and no mass. There is no tenderness. There is no rebound and no guarding. No hernia.  Skin: He is not diaphoretic.  Nursing note and vitals reviewed.    UC Treatments / Results  Labs (all labs ordered are listed, but only abnormal results are displayed) Labs Reviewed  CBC WITH DIFFERENTIAL/PLATELET - Abnormal; Notable for the following components:      Result Value   RBC 4.17 (*)    Hemoglobin 12.9 (*)    All other components within normal limits    EKG None  Radiology No results found.  Procedures Procedures (including critical care time)  Medications Ordered in UC Medications - No data to display  Initial Impression / Assessment and Plan / UC Course  I have reviewed the triage vital signs and the nursing notes.  Pertinent labs & imaging results that were available during my care of the patient were reviewed by me and considered in my medical decision making (see chart for details).      Final Clinical Impressions(s) / UC Diagnoses   Final diagnoses:  H/O acute pancreatitis    ED Prescriptions    None     1. Lab result (negative/normal) and diagnosis reviewed with patient 2. Continue current medications and follow up with PCP  Controlled Substance Prescriptions [place] Controlled Substance Registry consulted? Not Applicable   Payton Mccallum, MD 11/16/17 [phone]

## 2019-06-03 IMAGING — MR MR 3D RECON AT SCANNER
20 of 23 series · 41 of 48 positions shown · IV contrast (gadavist)
Comparison: MRI of the abdomen 10/13/2017.

CLINICAL DATA: 62-year-old male with history of elevated bilirubin.

EXAM:
MRI ABDOMEN WITHOUT AND WITH CONTRAST (INCLUDING MRCP)
TECHNIQUE: Multiplanar multisequence MR imaging of the abdomen was performed
both before and after the administration of intravenous contrast.
Heavily T2-weighted images of the biliary and pancreatic ducts were
obtained, and three-dimensional MRCP images were rendered by post
processing.
CONTRAST:  9 mL of Gadavist

[Series 3: bSSFP · coronal · 6.0mm · 0.78mm/px · 2 of 35 slices shown]
[im 1/35]
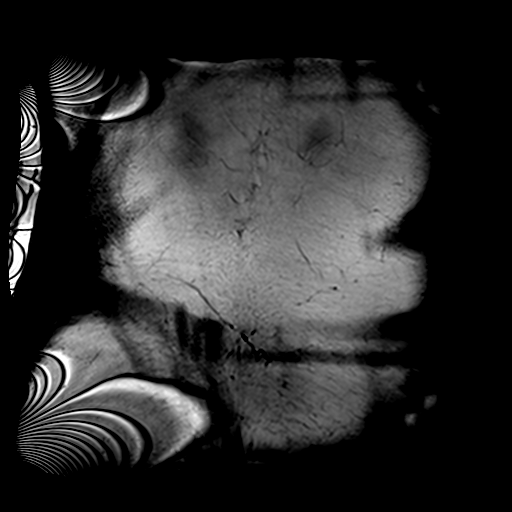
[im 35/35]
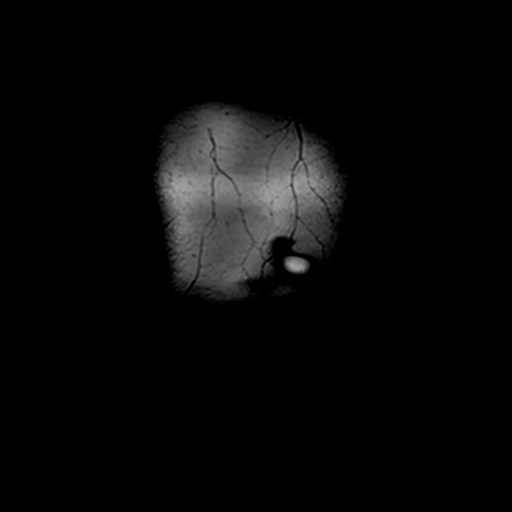

[Series 4: T2 · axial · 6.0mm · 1.19mm/px · z∈[-102,+158]mm · 2 of 37 slices shown]
[im 1/37]
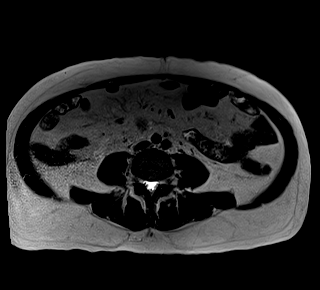
[im 37/37]
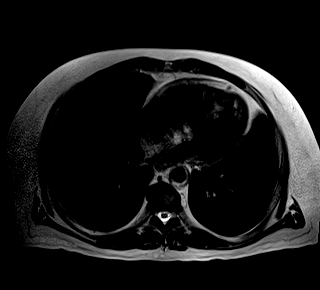

[Series 5: T1 · axial · 6.0mm · 0.74mm/px · z∈[-102,+158]mm · 2 of 37 slices shown (1 of 2)]
[im 1/37]
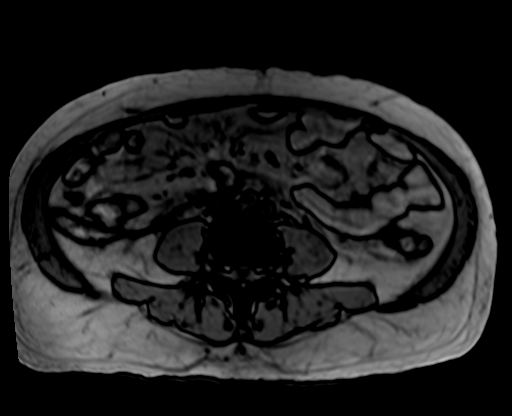
[im 37/37]
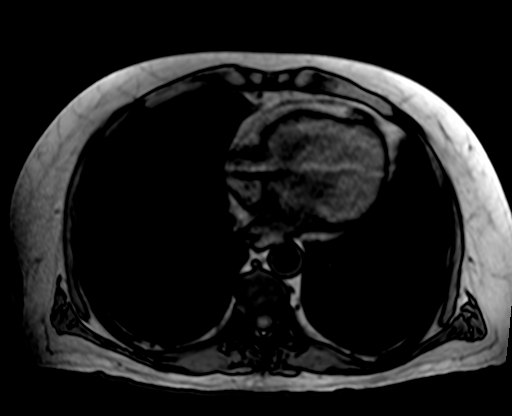

[Series 5: T1 · axial · 6.0mm · 0.74mm/px · 1 of 37 slices shown (2 of 2)]
[im 1/37]
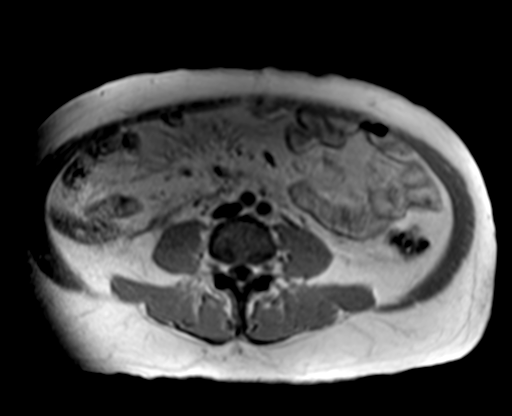

[Series 7: T2 fat-sat · axial · 6.0mm · 1.19mm/px · 1 of 37 slices shown]
[im 1/37]
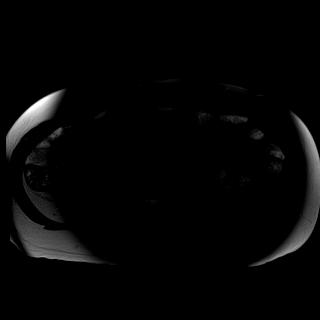

[Series 14: MRCP · coronal · 3.0mm · 1.12mm/px · 1 of 23 slices shown]
[im 1/23]
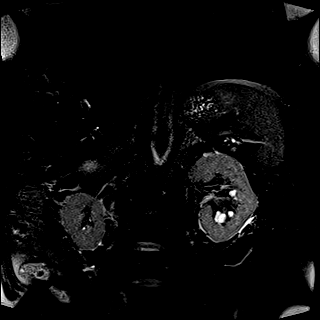

[Series 15: ax dwi_tracew · axial · 6.0mm · 1.42mm/px · 1 of 38 slices shown (1 of 3)]
[im 1/38]
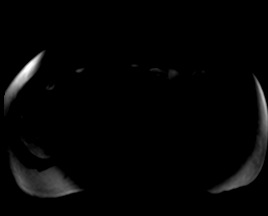

[Series 15: ax dwi_tracew · axial · 6.0mm · 1.42mm/px · 1 of 38 slices shown (2 of 3)]
[im 1/38]
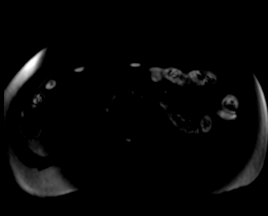

[Series 15: ax dwi_tracew · axial · 6.0mm · 1.42mm/px · 1 of 38 slices shown (3 of 3)]
[im 1/38]
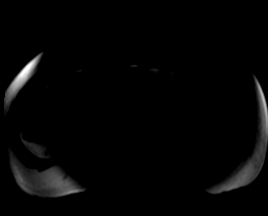

[Series 16: ax dwi_adc · axial · 6.0mm · 1.42mm/px · 1 of 38 slices shown]
[im 1/38]
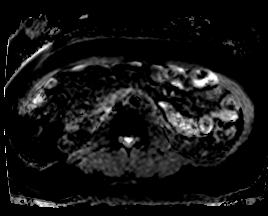

[Series 17: radials · coronal · 50.0mm · 0.78mm/px · 1 of 5 slices shown]
[im 1/5]
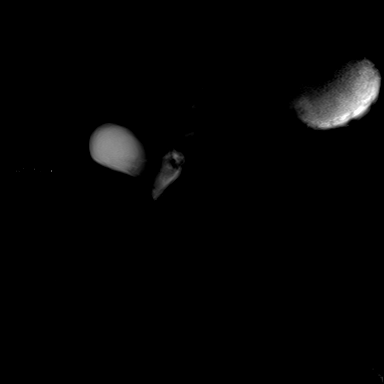

[Series 18: T1 dynamic fat-sat · axial · non-contrast · 3.0mm · 1.19mm/px · z∈[-110,+151]mm · 3 of 88 slices shown (1 of 4)]
[im 1/88]
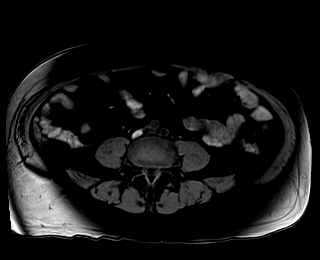
[im 44/88]
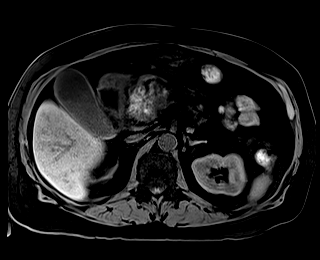
[im 88/88]
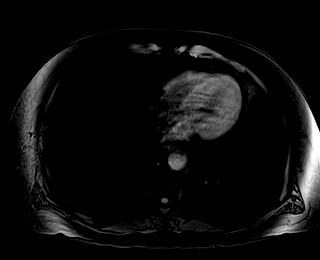

[Series 19: T1 dynamic fat-sat post-contrast · axial · 3.0mm · 1.19mm/px · z∈[-110,+151]mm · 3 of 88 slices shown (1 of 4)]
[im 1/88]
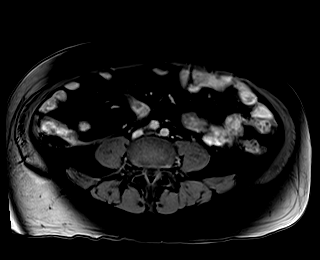
[im 44/88]
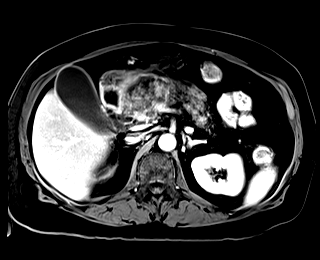
[im 88/88]
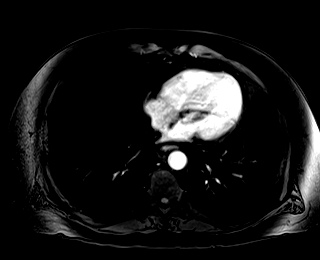

[Series 20: T1 dynamic fat-sat · axial · 3.0mm · 1.19mm/px · z∈[-110,+151]mm · 3 of 88 slices shown (2 of 4)]
[im 1/88]
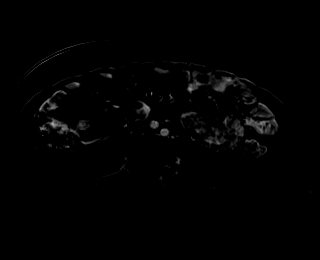
[im 44/88]
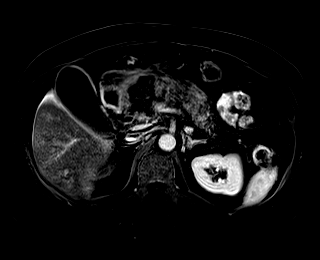
[im 88/88]
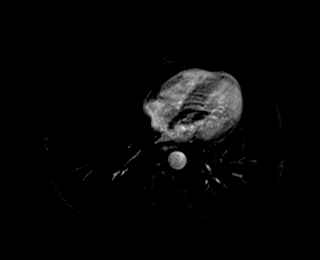

[Series 21: T1 dynamic fat-sat post-contrast · axial · 3.0mm · 1.19mm/px · z∈[-110,+151]mm · 3 of 88 slices shown (2 of 4)]
[im 1/88]
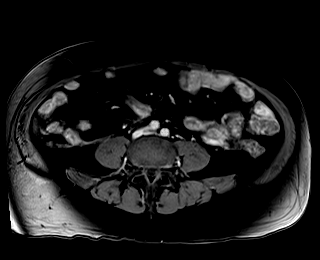
[im 44/88]
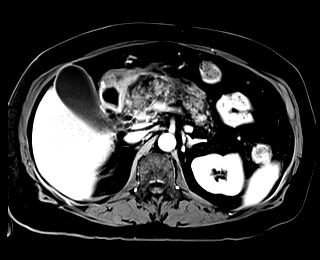
[im 88/88]
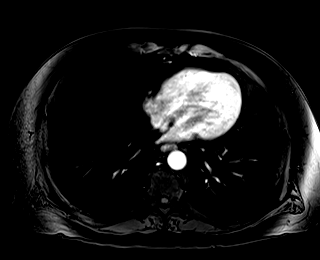

[Series 22: T1 dynamic fat-sat · axial · 3.0mm · 1.19mm/px · z∈[-110,+151]mm · 3 of 88 slices shown (3 of 4)]
[im 1/88]
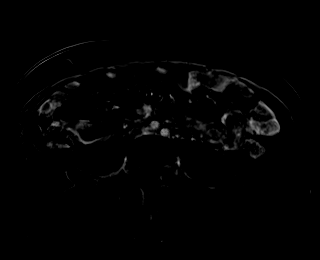
[im 44/88]
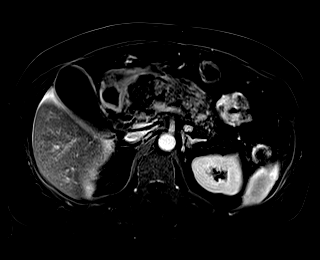
[im 88/88]
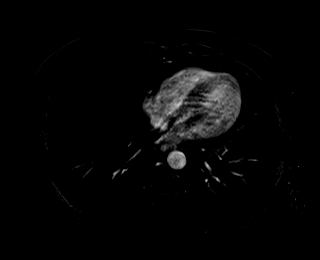

[Series 23: T1 dynamic fat-sat post-contrast · axial · 3.0mm · 1.19mm/px · z∈[-110,+151]mm · 3 of 88 slices shown (3 of 4)]
[im 1/88]
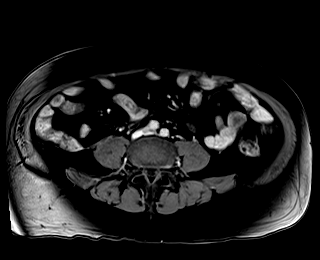
[im 44/88]
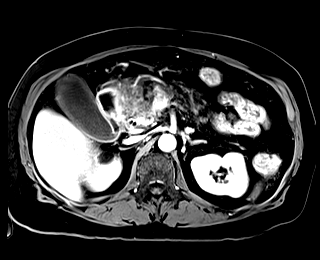
[im 88/88]
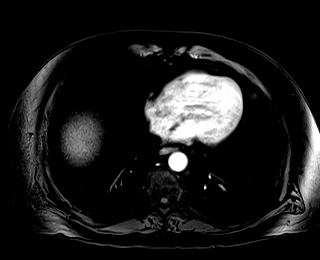

[Series 24: T1 dynamic fat-sat · axial · 3.0mm · 1.19mm/px · z∈[-110,+151]mm · 3 of 88 slices shown (4 of 4)]
[im 1/88]
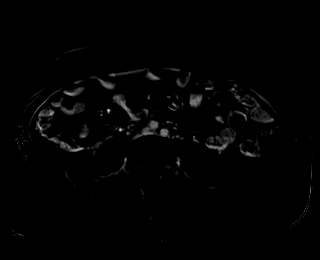
[im 44/88]
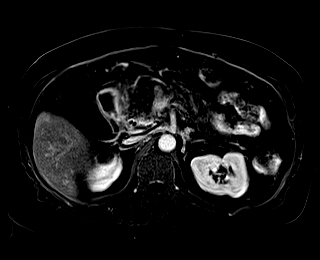
[im 88/88]
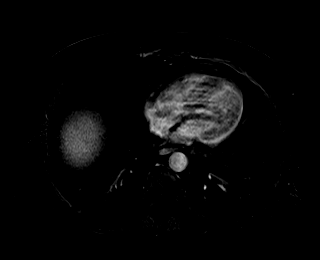

[Series 25: T1 dynamic post-contrast · coronal · 3.0mm · 1.31mm/px · 3 of 80 slices shown]
[im 1/80]
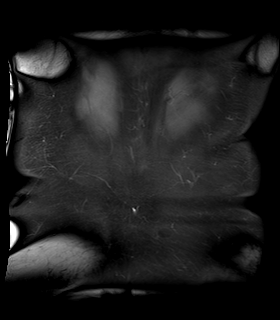
[im 40/80]
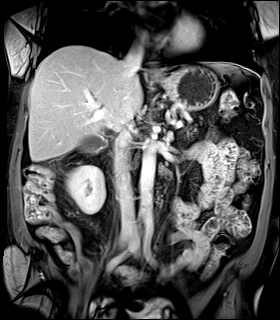
[im 80/80]
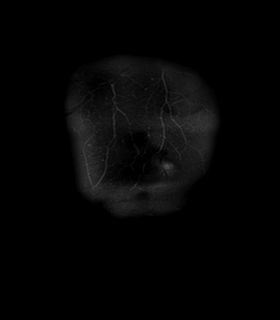

[Series 26: T1 dynamic fat-sat post-contrast · axial · 3.0mm · 1.19mm/px · z∈[-110,+151]mm · 3 of 88 slices shown (4 of 4)]
[im 1/88]
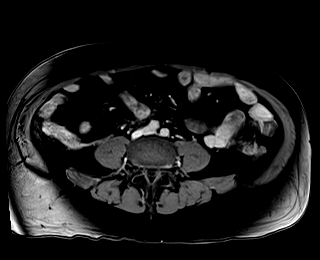
[im 44/88]
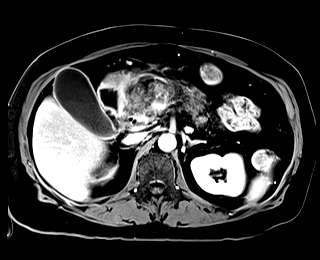
[im 88/88]
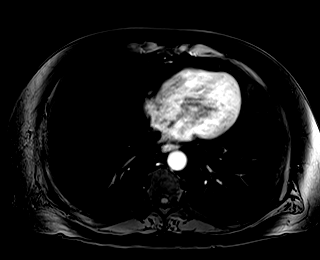

[41 of 48 positions shown; findings below may reference images not displayed]

FINDINGS: Lower chest: Unremarkable.

Hepatobiliary: No cystic or solid hepatic lesions. MRCP images
demonstrates some very mild intrahepatic biliary ductal dilatation.
Common hepatic duct measures up to 13 mm. Common bile duct is
markedly narrowed as it traverses through the head of the pancreas,
presumably from extrinsic mass effect. No definite filling defect in
the common bile duct to indicate choledocholithiasis. There is a
small amount of amorphous filling defect in the gallbladder, likely
to reflect a small amount of biliary sludge. Gallbladder is
otherwise unremarkable in appearance.

Pancreas: Areas of increased T1 signal intensity and decreased T2
signal intensity are noted in the pancreatic head and proximal
pancreatic body, which appears enlarged compared to the prior
examination, likely to reflect areas of developing pancreatic
necrosis and intrapancreatic hemorrhage. This exerts considerable
mass effect upon adjacent structures, including the common bile duct
which is narrowed (discussed above). This area demonstrates little
to no enhancement on subtraction imaging, suggesting significant
areas of pancreatic necrosis. MRCP images demonstrate marked
narrowing of the main pancreatic duct which is completely obscured
throughout the head and proximal body of the pancreas. In the distal
body and tail of the pancreas there is mild dilatation of the main
pancreatic duct which measures up to 5 mm in diameter. Severe
atrophy in the tail of the pancreas again noted. Ill-defined areas
of T2 hyperintensity are noted adjacent to the pancreatic head and
proximal body, likely to reflect a combination of inflammatory fluid
and small developing pancreatic pseudocysts. This inflammation
extends caudally to partially encompass the second and third
portions of the duodenum as well.

Spleen:  Unremarkable.

Adrenals/Urinary Tract: 2.4 cm simple cyst in the upper pole of the
right kidney. Left kidney and bilateral adrenal glands are normal in
appearance. No hydroureteronephrosis in the visualized portions of
the abdomen.

Stomach/Bowel: Normal appearance of the stomach. Inflammatory
changes adjacent to the second and third portions of the duodenum
related to adjacent pancreatitis. Otherwise, unremarkable.

Vascular/Lymphatic: No aneurysm identified in the visualized
abdominal vasculature. Splenic vein, superior mesenteric vein,
splenoportal confluence and portal vein are all patent at this time.

Other: Small amount of inflammatory exudate adjacent to the head and
proximal body of the pancreas. No significant volume of ascites.

Musculoskeletal: No aggressive appearing osseous lesions are noted
in the visualized portions of the skeleton.
IMPRESSION: 1. Progressive changes of pancreatitis with developing hemorrhagic
pancreatic necrosis in the pancreatic head and proximal body. This
exerts mass effect upon adjacent structures causing narrowing of the
pancreatic duct and the common bile duct. At this time, this is
associated with very mild intrahepatic biliary ductal dilatation.
2. Small amount of biliary sludge in the gallbladder.

## 2020-11-24 ENCOUNTER — Other Ambulatory Visit: Payer: Self-pay

## 2020-11-24 ENCOUNTER — Ambulatory Visit
Admission: EM | Admit: 2020-11-24 | Discharge: 2020-11-24 | Disposition: A | Discharge status: Home / Self Care | Payer: BC Managed Care – PPO | Attending: Emergency Medicine | Admitting: Emergency Medicine

## 2020-11-24 DIAGNOSIS — J09X2 Influenza due to identified novel influenza A virus with other respiratory manifestations: Secondary | ICD-10-CM

## 2020-11-24 LAB — RAPID INFLUENZA A&B ANTIGENS
Influenza A (ARMC): POSITIVE — AB
Influenza B (ARMC): NEGATIVE

## 2020-11-24 MED ORDER — OSELTAMIVIR PHOSPHATE 75 MG PO CAPS: 75.0000 mg | ORAL_CAPSULE | Freq: Two times a day (BID) | ORAL | 0 refills | Status: AC

## 2020-11-24 MED ORDER — BENZONATATE 200 MG PO CAPS: 200.0000 mg | ORAL_CAPSULE | Freq: Two times a day (BID) | ORAL | 0 refills | Status: AC | PRN

## 2020-11-24 NOTE — ED Triage Notes (Signed)
Pt here with C/O fever and cough since Wednesday. Wife is on Chemo and would like to protect her. States he knows it is just a cold.

## 2020-11-24 NOTE — ED Provider Notes (Signed)
[facility]    CSN: [id number] Arrival date & time: 11/24/20  1743      History   Chief Complaint Chief Complaint  Patient presents with   Cough    HPI Jesse Mays is a 65 y.o. male who presents with cough x 2 days, fever of 99. Has body aches and HA as well. Mild rhinitis. Has been fatigued and appetite is down.  His wife is on chemo and wants to be carefull he is not going to give her something. He feels he only has a cold.  Did a home covid test which was neg.    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 Authorizing Provider  aspirin EC 81 MG tablet Take 81 mg by mouth daily.   Yes [provider]  benzonatate (TESSALON) 200 MG capsule Take 1 capsule (200 mg total) by mouth 2 (two) times daily as needed for cough. 11/24/20  Yes Rodriguez-Southworth, Nettie Elm, PA-C  felodipine (PLENDIL) 5 MG 24 hr tablet Take 5 mg by mouth daily.  05/13/17  Yes [provider]  glucose blood test strip 1 each by Other route 2 (two) times daily.  05/19/17  Yes [provider]  LEVEMIR FLEXTOUCH 100 UNIT/ML Pen INJECT 6 UNITS SUBCUTANEOUSLY NIGHTLY INCREASE BY 2 UNITS AS DIRECTED 11/04/17  Yes [provider]  metFORMIN (GLUCOPHAGE-XR) 500 MG 24 hr tablet Take 1,000 mg by mouth 2 (two) times daily.  09/01/17  Yes [provider]  Multiple Vitamins-Minerals (PX COMPLETE SENIOR MULTIVITS) TABS Take by mouth.   Yes [provider]  ondansetron (ZOFRAN) 4 MG tablet Take by mouth. 10/22/17  Yes [provider]  oseltamivir (TAMIFLU) 75 MG capsule Take 1 capsule (75 mg total) by mouth every 12 (twelve) hours. 11/24/20  Yes Rodriguez-Southworth, Nettie Elm, PA-C  vitamin B-12 (CYANOCOBALAMIN) 1000 MCG tablet Take 1,000 mcg by mouth daily.    Yes [provider]  hydrALAZINE (APRESOLINE) 25 MG tablet Take 1 tablet (25 mg total) by mouth 3 (three) times daily. 10/15/17 11/16/17  Ihor Austin, MD  lisinopril (PRINIVIL,ZESTRIL) 40 MG tablet Take 40 mg by mouth daily.  01/31/17 01/31/18  [provider]  metoprolol tartrate (LOPRESSOR) 50 MG tablet Take 1 tablet (50 mg total) by mouth 2 (two) times daily. 10/15/17 11/16/17  Ihor Austin, MD    Family History Family History  Problem Relation Age of Onset   Diabetes Mother    Diabetes Father     Social History Social History   Tobacco Use   Smoking status: Never   Smokeless tobacco: Never  Substance Use Topics   Alcohol use: Yes    Comment: socially   Drug use: Never  Allergies   Patient has no known allergies.   Review of Systems Review of Systems  Constitutional:  Positive for appetite change, chills, fatigue and fever. Negative for activity change.  HENT:  Positive for rhinorrhea. Negative for ear discharge and ear pain.   Eyes:  Negative for discharge.  Respiratory:  Positive for cough. Negative for chest tightness and shortness of breath.   Gastrointestinal:  Negative for diarrhea, nausea and vomiting.  Genitourinary:  Negative for difficulty urinating.  Musculoskeletal:  Positive for myalgias.  Neurological:  Positive for headaches.  Hematological:  Negative for adenopathy.    Physical  Exam Triage Vital Signs ED Triage Vitals  Enc Vitals Group     BP 11/24/20 1940 (!) 180/108     Pulse Rate 11/24/20 1940 97     Resp 11/24/20 1940 18     Temp 11/24/20 1940 100.3 F (37.9 C)     Temp Source 11/24/20 1940 Oral     SpO2 11/24/20 1940 98 %     Weight 11/24/20 1939 210 lb (95.3 kg)     Height 11/24/20 1939 5\' 9"  (1.753 m)     Head Circumference --      Peak Flow --      Pain Score 11/24/20 1939 0     Pain Loc --      Pain Edu? --      Excl. in GC? --    No data found.  Updated Vital Signs BP (!) 170/102   Pulse 97   Temp 100.3 F (37.9 C) (Oral)   Resp 18   Ht 5\' 9"  (1.753 m)   Wt 210 lb (95.3 kg)   SpO2 98%   BMI 31.01 kg/m   Visual Acuity Right Eye Distance:   Left Eye Distance:   Bilateral Distance:    Right Eye Near:   Left Eye Near:    Bilateral Near:     Physical Exam Physical Exam Vitals signs and nursing note reviewed.  Constitutional:      General: he is not in acute distress.    Appearance: Normal appearance. He is ill-appearing,  but not toxic-appearing or diaphoretic.  HENT:     Head: Normocephalic.     Right Ear: Tympanic membrane, ear canal and external ear normal.     Left Ear: Tympanic membrane, ear canal and external ear normal.     Nose: clear rhinitis     Mouth/Throat: clear    Mouth: Mucous membranes are moist.  Eyes:     General: No scleral icterus.       Right eye: No discharge.        Left eye: No discharge.     Conjunctiva/sclera: Conjunctivae normal.  Neck:     Musculoskeletal: Neck supple. No neck rigidity.  Cardiovascular:     Rate and Rhythm: Normal rate and regular rhythm.     Heart sounds: No murmur.  Pulmonary:     Effort: Pulmonary effort is normal.     Breath sounds: Normal breath sounds.  Musculoskeletal: Normal range of motion.  Lymphadenopathy:     Cervical: No cervical adenopathy.  Skin:    General: Skin is warm and dry.     Coloration: Skin is not jaundiced.     Findings: No rash.   Neurological:     Mental Status: he is alert and oriented to person, place, and time.     Gait: Gait normal.  Psychiatric:        Mood and Affect: Mood  normal.        Behavior: Behavior normal.        Thought Content: Thought content normal.        Judgment: Judgment normal.    UC Treatments / Results  Labs (all labs ordered are listed, but only abnormal results are displayed) Labs Reviewed  RAPID INFLUENZA A&B ANTIGENS - Abnormal; Notable for the following components:      Result Value   Influenza A ([facility]) POSITIVE (*)    All other components within normal limits   Flu B neg  EKG   Radiology No results found.  Procedures Procedures (including critical care time)  Medications Ordered in UC Medications - No data to display  Initial Impression / Assessment and Plan / UC Course  I have reviewed the triage vital signs and the nursing notes. Pertinent labs results that were available during my care of the patient were reviewed by me and considered in my medical decision making (see chart for details). Has influenza A Was placed on Tessalon and Tamiflu as noted.     Final Clinical Impressions(s) / UC Diagnoses   Final diagnoses:  Influenza due to identified novel influenza A virus with other respiratory manifestations   Discharge Instructions   None    ED Prescriptions     Medication Sig Dispense Auth. Provider   oseltamivir (TAMIFLU) 75 MG capsule Take 1 capsule (75 mg total) by mouth every 12 (twelve) hours. 10 capsule Rodriguez-Southworth, Nettie Elm, PA-C   benzonatate (TESSALON) 200 MG capsule Take 1 capsule (200 mg total) by mouth 2 (two) times daily as needed for cough. 30 capsule Rodriguez-Southworth, Nettie Elm, PA-C      PDMP not reviewed this encounter.   Garey Ham, Cordelia Poche 11/24/20 2016
